# Patient Record
Sex: Female | Born: 1995 | Race: Black or African American | Hispanic: No | Marital: Single | Smoking: Current some day smoker
Health system: Southern US, Community
[De-identification: ages and names within clinical notes are randomized; demographics above are authoritative.]

## PROBLEM LIST (undated history)

## (undated) ENCOUNTER — Inpatient Hospital Stay (HOSPITAL_COMMUNITY): Payer: Self-pay

## (undated) DIAGNOSIS — Z789 Other specified health status: Secondary | ICD-10-CM

## (undated) DIAGNOSIS — F419 Anxiety disorder, unspecified: Secondary | ICD-10-CM

## (undated) HISTORY — PX: NO PAST SURGERIES: SHX2092

---
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
## 2015-02-10 ENCOUNTER — Inpatient Hospital Stay (HOSPITAL_COMMUNITY)
Admission: AD | Admit: 2015-02-10 | Discharge: 2015-02-10 | Disposition: A | Discharge status: Home / Self Care | Payer: Medicaid Other | Source: Ambulatory Visit | Attending: Obstetrics & Gynecology | Admitting: Obstetrics & Gynecology

## 2015-02-10 ENCOUNTER — Encounter (HOSPITAL_COMMUNITY): Payer: Self-pay | Admitting: *Deleted

## 2015-02-10 ENCOUNTER — Inpatient Hospital Stay (HOSPITAL_COMMUNITY): Payer: Medicaid Other

## 2015-02-10 ENCOUNTER — Emergency Department (HOSPITAL_COMMUNITY)
Admission: EM | Admit: 2015-02-10 | Discharge: 2015-02-10 | Disposition: A | Discharge status: Home / Self Care | Payer: Medicaid Other | Attending: Emergency Medicine | Admitting: Emergency Medicine

## 2015-02-10 DIAGNOSIS — M545 Low back pain, unspecified: Secondary | ICD-10-CM

## 2015-02-10 DIAGNOSIS — E860 Dehydration: Secondary | ICD-10-CM | POA: Insufficient documentation

## 2015-02-10 DIAGNOSIS — O26893 Other specified pregnancy related conditions, third trimester: Secondary | ICD-10-CM | POA: Insufficient documentation

## 2015-02-10 DIAGNOSIS — O0933 Supervision of pregnancy with insufficient antenatal care, third trimester: Secondary | ICD-10-CM | POA: Insufficient documentation

## 2015-02-10 DIAGNOSIS — Z3A35 35 weeks gestation of pregnancy: Secondary | ICD-10-CM | POA: Diagnosis not present

## 2015-02-10 DIAGNOSIS — O9989 Other specified diseases and conditions complicating pregnancy, childbirth and the puerperium: Secondary | ICD-10-CM | POA: Diagnosis present

## 2015-02-10 DIAGNOSIS — R1030 Lower abdominal pain, unspecified: Secondary | ICD-10-CM | POA: Insufficient documentation

## 2015-02-10 DIAGNOSIS — R102 Pelvic and perineal pain: Secondary | ICD-10-CM | POA: Insufficient documentation

## 2015-02-10 DIAGNOSIS — O99283 Endocrine, nutritional and metabolic diseases complicating pregnancy, third trimester: Secondary | ICD-10-CM | POA: Diagnosis not present

## 2015-02-10 DIAGNOSIS — Z369 Encounter for antenatal screening, unspecified: Secondary | ICD-10-CM

## 2015-02-10 HISTORY — DX: Other specified health status: Z78.9

## 2015-02-10 LAB — URINALYSIS, ROUTINE W REFLEX MICROSCOPIC
Bilirubin Urine: NEGATIVE
Glucose, UA: NEGATIVE mg/dL
HGB URINE DIPSTICK: NEGATIVE
Ketones, ur: 15 mg/dL — AB
Leukocytes, UA: NEGATIVE
Nitrite: NEGATIVE
Protein, ur: NEGATIVE mg/dL
SPECIFIC GRAVITY, URINE: 1.024 (ref 1.005–1.030)
UROBILINOGEN UA: 1 mg/dL (ref 0.0–1.0)
pH: 6 (ref 5.0–8.0)

## 2015-02-10 LAB — OB RESULTS CONSOLE GC/CHLAMYDIA: Gonorrhea: NEGATIVE

## 2015-02-10 LAB — OB RESULTS CONSOLE GBS: GBS: POSITIVE

## 2015-02-10 MED ORDER — SODIUM CHLORIDE 0.9 % IV BOLUS (SEPSIS)
1000.0000 mL | Freq: Once | INTRAVENOUS | Status: AC
  Administered 2015-02-10: 1000 mL via INTRAVENOUS

## 2015-02-10 MED ORDER — ACETAMINOPHEN 500 MG PO TABS
1000.0000 mg | ORAL_TABLET | Freq: Once | ORAL | Status: AC
  Administered 2015-02-10: 1000 mg via ORAL
  Filled 2015-02-10: qty 2

## 2015-02-10 NOTE — Discharge Instructions (Signed)
Braxton Hicks Contractions °Contractions of the uterus can occur throughout pregnancy. Contractions are not always a sign that you are in labor.  °WHAT ARE BRAXTON HICKS CONTRACTIONS?  °Contractions that occur before labor are called Braxton Hicks contractions, or false labor. Toward the end of pregnancy (32-34 weeks), these contractions can develop more often and may become more forceful. This is not true labor because these contractions do not result in opening (dilatation) and thinning of the cervix. They are sometimes difficult to tell apart from true labor because these contractions can be forceful and people have different pain tolerances. You should not feel embarrassed if you go to the hospital with false labor. Sometimes, the only way to tell if you are in true labor is for your health care provider to look for changes in the cervix. °If there are no prenatal problems or other health problems associated with the pregnancy, it is completely safe to be sent home with false labor and await the onset of true labor. °HOW CAN YOU TELL THE DIFFERENCE BETWEEN TRUE AND FALSE LABOR? °False Labor °· The contractions of false labor are usually shorter and not as hard as those of true labor.   °· The contractions are usually irregular.   °· The contractions are often felt in the front of the lower abdomen and in the groin.   °· The contractions may go away when you walk around or change positions while lying down.   °· The contractions get weaker and are shorter lasting as time goes on.   °· The contractions do not usually become progressively stronger, regular, and closer together as with true labor.   °True Labor °· Contractions in true labor last 30-70 seconds, become very regular, usually become more intense, and increase in frequency.   °· The contractions do not go away with walking.   °· The discomfort is usually felt in the top of the uterus and spreads to the lower abdomen and low back.   °· True labor can be  determined by your health care provider with an exam. This will show that the cervix is dilating and getting thinner.   °WHAT TO REMEMBER °· Keep up with your usual exercises and follow other instructions given by your health care provider.   °· Take medicines as directed by your health care provider.   °· Keep your regular prenatal appointments.   °· Eat and drink lightly if you think you are going into labor.   °· If Braxton Hicks contractions are making you uncomfortable:   °¨ Change your position from lying down or resting to walking, or from walking to resting.   °¨ Sit and rest in a tub of warm water.   °¨ Drink 2-3 glasses of water. Dehydration may cause these contractions.   °¨ Do slow and deep breathing several times an hour.   °WHEN SHOULD I SEEK IMMEDIATE MEDICAL CARE? °Seek immediate medical care if: °· Your contractions become stronger, more regular, and closer together.   °· You have fluid leaking or gushing from your vagina.   °· You have a fever.   °· You pass blood-tinged mucus.   °· You have vaginal bleeding.   °· You have continuous abdominal pain.   °· You have low back pain that you never had before.   °· You feel your baby's head pushing down and causing pelvic pressure.   °· Your baby is not moving as much as it used to.   °  °This information is not intended to replace advice given to you by your health care provider. Make sure you discuss any questions you have with your health care   provider. °  °Document Released: 03/28/2005 Document Revised: 04/02/2013 Document Reviewed: 01/07/2013 °Elsevier Interactive Patient Education ©2016 [employer]vier Inc. ° °

## 2015-02-10 NOTE — MAU Provider Note (Signed)
History     CSN: [id number]  Arrival date and time: 02/10/15 4098   First Provider Initiated Contact with Patient 02/10/15 2045      No chief complaint on file.  HPI Ms. Sharon Young is a 19 y.o. G1P0 at [redacted]w[redacted]d who presents to MAU today from [facility] for further evaluation of a possible twin pregnancy. The patient had a bedside US performed at [facility] tonight that may have shown a twin gestation. She states that she was seen because she was concerned about contractions. She was having lower abdominal pain. She states that pain is only with ambulation. She denies pain now while resting. She denies vaginal bleeding, discharge or LOF. She reports good fetal movement. The patient is scheduled to start prenatal care and have anatomy scan tomorrow.   OB History    Gravida Para Term Preterm AB TAB SAB Ectopic Multiple Living   1               Past Medical History  Diagnosis Date  . Medical history non-contributory     Past Surgical History  Procedure Laterality Date  . No past surgeries      History reviewed. No pertinent family history.  Social History  Substance Use Topics  . Smoking status: Never Smoker   . Smokeless tobacco: None  . Alcohol Use: No    Allergies: No Known Allergies  Prescriptions prior to admission  Medication Sig Dispense Refill Last Dose  . Prenatal Multivit-Min-Fe-FA (PRENATAL VITAMINS) 0.8 MG tablet Take 1 tablet by mouth daily. (Patient not taking: Reported on 02/10/2015) 30 tablet 12 Not Taking at Unknown time    Review of Systems  Constitutional: Negative for fever.  Gastrointestinal: Positive for abdominal pain.  Genitourinary:       Neg - vaginal bleeding, discharge, LOF   Physical Exam   Blood pressure 121/69, pulse 85, temperature 98.5 F (36.9 C), temperature source Oral, resp. rate 20, height  (1.549 m), weight 156 lb 6 oz (70.931 kg), last menstrual period 06/05/2014.  Physical Exam  Nursing note and vitals  reviewed. Constitutional: She is oriented to person, place, and time. She appears well-developed and well-nourished. No distress.  HENT:  Head: Normocephalic and atraumatic.  Cardiovascular: Normal rate.   Respiratory: Effort normal.  GI: Soft. She exhibits no distension and no mass. There is no tenderness. There is no rebound and no guarding.  Neurological: She is alert and oriented to person, place, and time.  Skin: Skin is warm and dry. No erythema.  Psychiatric: She has a normal mood and affect.  Dilation: Fingertip Cervical Position: Posterior Exam by:: Harlon Flor PA and Sharen Hint [facility]  Results for orders placed or performed during the hospital encounter of 02/10/15 (from the past 24 hour(s))  Urinalysis, Routine w reflex microscopic     Status: Abnormal   Collection Time: 02/10/15  5:37 PM  Result Value Ref Range   Color, Urine AMBER (A) YELLOW   APPearance CLEAR CLEAR   Specific Gravity, Urine 1.024 1.005 - 1.030   pH 6.0 5.0 - 8.0   Glucose, UA NEGATIVE NEGATIVE mg/dL   Hgb urine dipstick NEGATIVE NEGATIVE   Bilirubin Urine NEGATIVE NEGATIVE   Ketones, ur 15 (A) NEGATIVE mg/dL   Protein, ur NEGATIVE NEGATIVE mg/dL   Urobilinogen, UA 1.0 0.0 - 1.0 mg/dL   Nitrite NEGATIVE NEGATIVE   Leukocytes, UA NEGATIVE NEGATIVE   Fetal Monitoring: Baseline: 140 bpm, moderate variability, + accelerations, no decelerations Contractions: few, irregular, mild  with UI  MAU Course  Procedures None  MDM UA today Discussed patient with Dr Penne LashLeggett. She recommends formal US to evaluate for twin pregnancy and obtain dating due to lack of prenatal care.  US Ob Limited ordered - preliminary US shows singleton pregnancy. Anatomy scan performed. Normal AFI and placenta without evidence of previa. Patient will just require follow-up with [facility] tomorrow and US will be cancelled.  1000 mg Tylenol given  Assessment and Plan  A: SIUP at  [place] ligament pain  P: Discharge home Tylenol PRN  for pain Discussed use of abdominal binder and warm shower/bath for pain Preterm labor precautions discussed Increased PO hydration advised Patient advised to follow-up with [facility] tomorrow as planned to start prenatal care Patient may return to MAU as needed or if her condition were to change or worsen   Marny LowensteinJulie N Jearld Hemp, PA-C  02/10/2015, 8:45 PM

## 2015-02-10 NOTE — MAU Note (Signed)
PT SAYS TONIGHT  SHE WENT  TO [facility]  AT 230PM-     FOR  UC  PAIN  .  SHE  WAS  IN MAU  IN   10-2014.     AT   [facility]   -  U/S    - DISCOVERED  TWINS.    [facility]  BUT  [facility]  TOLD  HER  TO COME  HERE  NOW.     NOW  IN TRIAGE  -  SHE  STILLS  FEELS  PAIN  WHEN SHE WALKS.   DENIES HSV AND MRSA.

## 2015-02-10 NOTE — ED Provider Notes (Signed)
CSN: [id number]     Arrival date & time 02/10/15  1450 History   First MD Initiated Contact with Patient 02/10/15 1509     Chief Complaint  Patient presents with  . Abdominal Pain     (Consider location/radiation/quality/duration/timing/severity/associated sxs/prior Treatment) Patient is a 19 y.o. female presenting with back pain. The history is provided by the patient.  Back Pain Location:  Lumbar spine Quality:  Aching Radiates to:  Does not radiate Pain severity:  Moderate Pain is:  Same all the time Onset quality:  Gradual Timing:  Intermittent Progression:  Worsening Chronicity:  New Relieved by:  None tried Worsened by:  Nothing tried Ineffective treatments:  None tried Associated symptoms: no abdominal pain, no chest pain, no dysuria, no fever and no headaches   Risk factors: pregnancy     Past Medical History  Diagnosis Date  . Medical history non-contributory    Past Surgical History  Procedure Laterality Date  . No past surgeries     History reviewed. No pertinent family history. Social History  Substance Use Topics  . Smoking status: Never Smoker   . Smokeless tobacco: None  . Alcohol Use: No   OB History    Gravida Para Term Preterm AB TAB SAB Ectopic Multiple Living   1              Review of Systems  Constitutional: Negative for fever.  HENT: Negative for facial swelling.   Respiratory: Negative for shortness of breath.   Cardiovascular: Negative for chest pain.  Gastrointestinal: Negative for abdominal pain.  Genitourinary: Negative for dysuria.  Musculoskeletal: Positive for back pain.  Skin: Negative for rash.  Neurological: Negative for headaches.  Psychiatric/Behavioral: Negative for confusion.      Allergies  Review of patient's allergies indicates no known allergies.  Home Medications   Prior to Admission medications   Medication Sig Start Date End Date Taking? Authorizing Provider  Prenatal Multivit-Min-Fe-FA (PRENATAL  VITAMINS) 0.8 MG tablet Take 1 tablet by mouth daily. Patient not taking: Reported on 02/10/2015 10/28/14   Aviva Signs, CNM   BP 135/107 mmHg  Pulse 98  Temp(Src) 98.1 F (36.7 C) (Oral)  Resp 18  SpO2 99%  LMP 06/05/2014 (Approximate) Physical Exam  Constitutional: She is oriented to person, place, and time. She appears well-developed and well-nourished. No distress.  HENT:  Head: Normocephalic and atraumatic.  Right Ear: External ear normal.  Left Ear: External ear normal.  Nose: Nose normal.  Mouth/Throat: Oropharynx is clear and moist. No oropharyngeal exudate.  Eyes: Conjunctivae and EOM are normal. Pupils are equal, round, and reactive to light. Right eye exhibits no discharge. Left eye exhibits no discharge. No scleral icterus.  Neck: Normal range of motion. Neck supple. No JVD present. No tracheal deviation present. No thyromegaly present.  Cardiovascular: Normal rate, regular rhythm and intact distal pulses.   Pulmonary/Chest: Effort normal and breath sounds normal. No stridor. No respiratory distress. She has no wheezes. She has no rales. She exhibits no tenderness.  Abdominal: Soft. There is no tenderness.  Musculoskeletal: Normal range of motion. She exhibits no edema or tenderness.  Lymphadenopathy:    She has no cervical adenopathy.  Neurological: She is alert and oriented to person, place, and time. She displays no atrophy, no tremor and normal reflexes. She exhibits normal muscle tone. She displays no seizure activity. Coordination and gait normal. GCS eye subscore is 4. GCS verbal subscore is 5. GCS motor subscore is 6.  Skin: Skin is  warm and dry. No rash noted. She is not diaphoretic. No erythema. No pallor.  Psychiatric: She has a normal mood and affect. Her behavior is normal. Judgment and thought content normal.  Nursing note and vitals reviewed.   ED Course  Procedures (including critical care time) Labs Review Labs Reviewed  URINALYSIS, ROUTINE W REFLEX  MICROSCOPIC (NOT AT [facility]) - Abnormal; Notable for the following:    Color, Urine AMBER (*)    Ketones, ur 15 (*)    All other components within normal limits  CULTURE, BETA STREP (GROUP B ONLY)  GC/CHLAMYDIA PROBE AMP ([place]) NOT AT [facility]    Imaging Review [place] [facility] Ob Comp + 14 Wk  02/11/2015  OBSTETRICAL ULTRASOUND: This exam was performed within a [facility]. The OB US report was generated in the [facility] system, and faxed to the ordering physician.  This report is available in the [facility] [employer] PACS. See the [facility] Obstetric US report via the Image Link.  I have personally reviewed and evaluated these images and lab results as part of my medical decision-making.   EKG Interpretation None      MDM   Final diagnoses:  No prenatal care in current pregnancy, third trimester  Low back pain during pregnancy in third trimester  Dehydration    Pt at 35 wks by dates with low back pain.  No prior prenatal care.  Pt placed on toco by [facility] L&D nurse.  Reactive category 1 tracing.  No LOF, VB, or VD.  Pt having intermittent contractions.  Dehydration by history with low oral intake of fluids today.  No prior US of pregancy.  BSUS indeterminate due to late dates.  Discussed with OB o/c due to concern for possible twin pregnancy with different HR obtained in two locations by doppler, which do not correlate with maternal NR.  Pt was initially planned for close follow up tomorrow with anatomy US, but due to concern for possible twin gestation OB will [facility] tonight.  Pt otherwise stable.  D/c for outpatient follow up with OB this evening after leaving ED.  No signs of UTI.  Cultures sent for GC, and GBS.  Tylenol for pain.  Patient was given return precautions for pregnancy.  Pt advised on use of medications as applicable.  Advised to return for actely worsening symptoms, inability to take medications, or other acute concerns.  Advised to follow up with OB today.  Patient was in  agreement with and expressed understanding of follow plan, plan of care, and return precautions.  All questions answered prior to discharge.  Patient was discharged in stable condition, ambulating without difficulty.   Patient care was discussed with my attending, Dr. Verdie MosherLiu.    Gavin PoundJustin Brason Berthelot, MD 02/12/15 1120  Lavera Guiseana Duo Liu, MD 02/13/15 1215

## 2015-02-10 NOTE — ED Notes (Signed)
Pt states she is [redacted] weeks pregnant and has been having increasing pain in abdomen with walking. No vaginal discharge or bleeding.  LMP 06/05/14, Due Date 03/12/2015.  PT has not had any prenatal care. G-1 P-0 A-0.

## 2015-02-10 NOTE — ED Notes (Signed)
Pt verbalizes understanding of f/u care re: r/o twins per MD

## 2015-02-10 NOTE — Progress Notes (Signed)
Attempted to find FHR of twins [facility]. Not pregnant with twins. Confirmed via ultrasound.

## 2015-02-10 NOTE — ED Notes (Signed)
Discharge instructions given to patient.  [facility] for and ultrasound  Voiced understanding

## 2015-02-10 NOTE — Progress Notes (Signed)
1520  Arrived to evaluate this 19 yo G1P0 @ 35.[redacted] wks GA in with complaint of abdominal pain.  Denies vaginal bleeding or leaking of fluid.  Reports good fetal movement.  Patient does not receive prenatal care.  Upon placement of fetal monitors FHR is noted to be Category I and UC's are present.  UC's palpate mild and patient is not breathing through them.  1547  Call placed to Dr. Macon LargeAnyanwu.  She agrees that SVE is not necessary at this time.  She requests GC/CC and GBS labs be collected.  She will [facility] and will call [facility] RN back with arrangements.  1630 Patient is OB cleared. Pollux.RosserOBRR RN will report Dr. Mont DuttonAnyanwu's recommendations and plan for follow up to EDP.

## 2015-02-10 NOTE — ED Notes (Signed)
Ob rapid Response Rn at bedside, toco at bedside, pt A&O x4

## 2015-02-11 ENCOUNTER — Emergency Department (HOSPITAL_COMMUNITY): Payer: Medicaid Other

## 2015-02-11 ENCOUNTER — Encounter: Payer: Managed Care, Other (non HMO) | Admitting: Advanced Practice Midwife

## 2015-02-11 LAB — CULTURE, BETA STREP (GROUP B ONLY)

## 2015-02-11 NOTE — ED Provider Notes (Signed)
I saw and evaluated the patient, reviewed the resident's note and I agree with the findings and plan.   19 year old female G1P0 at 6935 weeks GA who presents with intermittent low back pan radiating into her lower groin/abdoment over past few days. No vaginal bleeding or leakage of fluids. No N/V/D or urinary complaints. Pain primarily reproduced with movement, and seems MSK related. Abdomen gravid, but non-tender to palpation. Blood work unremarkable. OB nurse evaluated patient at bedside and monitoring not suggestive of contractions. No prior prenatal care, thus was arranged for anatomy US tomorrow with OB. Appropriate for discharge.   Lavera Guiseana Duo Anchor Dwan, MD 02/11/15 1300

## 2015-02-13 LAB — GC/CHLAMYDIA PROBE AMP (~~LOC~~) NOT AT ARMC
Chlamydia: NEGATIVE
Neisseria Gonorrhea: NEGATIVE

## 2015-02-18 ENCOUNTER — Encounter: Payer: Self-pay | Admitting: Certified Nurse Midwife

## 2015-02-18 ENCOUNTER — Encounter: Payer: Managed Care, Other (non HMO) | Admitting: Certified Nurse Midwife

## 2015-02-25 ENCOUNTER — Encounter: Payer: Self-pay | Admitting: Physician Assistant

## 2015-02-25 ENCOUNTER — Ambulatory Visit (INDEPENDENT_AMBULATORY_CARE_PROVIDER_SITE_OTHER): Payer: Self-pay | Admitting: Physician Assistant

## 2015-02-25 VITALS — BP 107/80 | HR 104 | Temp 97.7°F | Ht 60.0 in | Wt 158.3 lb

## 2015-02-25 DIAGNOSIS — O0933 Supervision of pregnancy with insufficient antenatal care, third trimester: Secondary | ICD-10-CM

## 2015-02-25 DIAGNOSIS — Z3403 Encounter for supervision of normal first pregnancy, third trimester: Secondary | ICD-10-CM

## 2015-02-25 DIAGNOSIS — Z3493 Encounter for supervision of normal pregnancy, unspecified, third trimester: Secondary | ICD-10-CM

## 2015-02-25 DIAGNOSIS — Z3490 Encounter for supervision of normal pregnancy, unspecified, unspecified trimester: Secondary | ICD-10-CM | POA: Insufficient documentation

## 2015-02-25 LAB — POCT URINALYSIS DIP (DEVICE)
Bilirubin Urine: NEGATIVE
Glucose, UA: NEGATIVE mg/dL
KETONES UR: NEGATIVE mg/dL
Leukocytes, UA: NEGATIVE
Nitrite: NEGATIVE
PH: 6 (ref 5.0–8.0)
PROTEIN: NEGATIVE mg/dL
SPECIFIC GRAVITY, URINE: 1.01 (ref 1.005–1.030)
Urobilinogen, UA: 0.2 mg/dL (ref 0.0–1.0)

## 2015-02-25 MED ORDER — PRENATAL VITAMINS 0.8 MG PO TABS: 1.0000 | ORAL_TABLET | Freq: Every day | ORAL | Status: DC

## 2015-02-25 NOTE — Addendum Note (Signed)
Addended by: Jill SideAY, Cosandra Plouffe L on: 02/25/2015 12:15 PM   Modules accepted: Orders

## 2015-02-25 NOTE — Patient Instructions (Signed)
Pain Relief During Labor and Delivery Everyone experiences pain differently, but labor causes severe pain for many women. The amount of pain you experience during labor and delivery depends on your pain tolerance, contraction strength, and your baby's size and position. There are many ways to prepare for and deal with the pain, including:   Taking prenatal classes to learn about labor and delivery. The more informed you are, the less anxious and afraid you may be. This can help lessen the pain.  Taking pain-relieving medicine during labor and delivery.  Learning breathing and relaxation techniques.  Taking a shower or bath.  Getting massaged.  Changing positions.  Placing an ice pack on your back. Discuss your pain control options with your health care provider during your prenatal visits.  WHAT ARE THE TWO TYPES OF PAIN-RELIEVING MEDICINES? 1. Analgesics. These are medicines that decrease pain without total loss of feeling or muscle movement. 2. Anesthetics. These are medicines that block all feeling, including pain. There can be minor side effects of both types, such as nausea, trouble concentrating, becoming sleepy, and lowering the heart rate of the baby. However, health care providers are careful to give doses that will not seriously affect the baby.  WHAT ARE THE SPECIFIC TYPES OF ANALGESICS AND ANESTHETICS? Systemic Analgesic Systemic pain medicines affect your whole body rather than focusing pain relief on the area of your body experiencing pain. This type of medicine is given either through an IV tube in your vein or by a shot (injection) into your muscle. This medicine will lessen your pain but will not stop it completely. It may also make you sleepy, but it will not make you lose consciousness.  Local Anesthetic Local anesthetic isused tonumb a small area of your body. The medicine is injected into the area of nerves that carry feeling to the vagina, vulva, or the area between  the vagina and anus (perineum).  General Anesthetic This type of medicine causes you to lose consciousness so you do not feel pain. It is usually used only in emergency situations during labor. It is given through an IV tube or face mask. Paracervical Block A paracervical block is a form of local anesthesia given during labor. Numbing medicine is injected into the right and left sides of the cervix and vagina. It helps to lessen the pain caused by contractions and stretching of the cervix. It may have to be given more than once.  Pudendal Block A pudendal block is another form of local anesthesia. It is used to relieve the pain associated with pushing or stretching of the perineum at the time of delivery. An injection is given deep through the vaginal wall into the pudendal nerve in the pelvis, numbing the perineum.  Epidural Anesthetic An epidural is an injection of numbing medicine given in the lower back and into the epidural space near your spinal cord. The epidural numbs the lower half of your body. You may be able to move your legs but will not be allowed to walk. Epidurals can be used for labor, delivery, or cesarean deliveries.  To prevent the medicine from wearing off, a small tube (catheter) may be threaded into the epidural space and taped in place to prevent it from slipping out. Medicine can then be given continuously in small doses through the tube until you deliver. Spinal Block A spinal block is similar to an epidural, but the medicine is injected into the spinal fluid, not the epidural space. A spinal block is only given   once. It starts to relieve pain quickly but lasts only 1-2 hours. Spinal blocks can also be used for cesarean deliveries.  Combined Spinal-Epidural Block Combined spinal-epidural blocks combine the benefits of both the spinal and epidural blocks. The spinal part acts quickly to relieve pain and the epidural provides continuous pain relief. Hydrotherapy Immersion in  warm water during labor may provide comfort and relaxation. It may also help to lessen pain, the use of anesthesia, and the length of labor. However, immersion in water during the delivery (water birth) may have some risk involved and studies to determine safety and risks are ongoing. If you are a healthy woman who is expecting an uncomplicated birth, talk with your health care provider to see if water birth is an option for you.    This information is not intended to replace advice given to you by your health care provider. Make sure you discuss any questions you have with your health care provider.   Document Released: 07/14/2008 Document Revised: 04/02/2013 Document Reviewed: 08/16/2012 Elsevier Interactive Patient Education 2016 Elsevier Inc.  

## 2015-02-25 NOTE — Progress Notes (Signed)
Breastfeeding tip of the week reviewed. Pt plans to bottlefeed and place baby for adoption. Working with [facility].

## 2015-02-25 NOTE — Progress Notes (Signed)
    Subjective:    Sharon Young is a G1P0 5553w6d being seen today for her first obstetrical visit.  Her obstetrical history is significant for planning for adoptive placement.. Patient does not intend to breast feed. Pregnancy history fully reviewed.  Patient reports no complaints.  Filed Vitals:   02/25/15 0803 02/25/15 0808  BP: 107/80   Pulse: 104   Temp: 97.7 F (36.5 C)   Height:  5' (1.524 m)  Weight: 158 lb 4.8 oz (71.804 kg)     HISTORY: OB History  Gravida Para Term Preterm AB SAB TAB Ectopic Multiple Living  1             # Outcome Date GA Lbr Len/2nd Weight Sex Delivery Anes PTL Lv  1 Current              Past Medical History  Diagnosis Date  . Medical history non-contributory    Past Surgical History  Procedure Laterality Date  . No past surgeries     Family History  Problem Relation Age of Onset  . Diabetes Mother   . Arthritis Father      Exam    Uterus:   gravid                                 System:     Skin: normal coloration and turgor, no rashes    Neurologic: oriented, normal mood, grossly non-focal   Extremities: no deformities, gait was normal for age, no musculoskeletal defects noted   HEENT extra ocular movement intact, oropharynx clear, no lesions, neck supple with midline trachea and thyroid without masses   Mouth/Teeth mucous membranes moist, pharynx normal without lesions and dental hygiene good   Neck supple and no masses   Cardiovascular: regular rate and rhythm, no murmurs or gallops   Respiratory:  appears well, vitals normal, no respiratory distress, acyanotic, normal RR, ear and throat exam is normal, neck free of mass or lymphadenopathy, chest clear, no wheezing, crepitations, rhonchi, normal symmetric air entry   Abdomen: soft, non-tender; bowel sounds normal; no masses,  no organomegaly          Assessment:    Pregnancy: G1P0 Patient Active Problem List   Diagnosis Date Noted  . Late prenatal care  affecting pregnancy in third trimester 02/25/2015  . Supervision of normal first pregnancy in third trimester 02/25/2015  Adoption planned (agency is [facility])      Plan:     Initial labs drawn. Prenatal vitamins. Problem list reviewed and updated. Genetic Screening discussed Quad Screen: too late.  Ultrasound discussed; fetal survey: results reviewed.  Follow up in 1 weeks. 100% of 45 min visit spent on counseling and coordination of care.  Flu shot today   Bertram DenverKaren E Teague Clark 02/25/2015

## 2015-02-26 LAB — PRESCRIPTION MONITORING PROFILE (19 PANEL)
Amphetamine/Meth: NEGATIVE ng/mL
BARBITURATE SCREEN, URINE: NEGATIVE ng/mL
Benzodiazepine Screen, Urine: NEGATIVE ng/mL
Buprenorphine, Urine: NEGATIVE ng/mL
CARISOPRODOL, URINE: NEGATIVE ng/mL
COCAINE METABOLITES: NEGATIVE ng/mL
CREATININE, URINE: 38.81 mg/dL (ref >20.0)
Cannabinoid Scrn, Ur: NEGATIVE ng/mL
ECSTASY: NEGATIVE ng/mL
Fentanyl, Ur: NEGATIVE ng/mL
MEPERIDINE UR: NEGATIVE ng/mL
Methadone Screen, Urine: NEGATIVE ng/mL
Methaqualone: NEGATIVE ng/mL
Nitrites, Initial: NEGATIVE ug/mL
OXYCODONE SCRN UR: NEGATIVE ng/mL
Opiate Screen, Urine: NEGATIVE ng/mL
PH URINE, INITIAL: 5.2 pH (ref 4.5–8.9)
Phencyclidine, Ur: NEGATIVE ng/mL
Propoxyphene: NEGATIVE ng/mL
TRAMADOL UR: NEGATIVE ng/mL
Tapentadol, urine: NEGATIVE ng/mL
ZOLPIDEM, URINE: NEGATIVE ng/mL

## 2015-02-26 LAB — PRENATAL PROFILE (SOLSTAS)
Antibody Screen: NEGATIVE
Basophils Absolute: 0 10*3/uL (ref 0.0–0.1)
Basophils Relative: 0 % (ref 0–1)
Eosinophils Absolute: 0.1 10*3/uL (ref 0.0–0.7)
Eosinophils Relative: 1 % (ref 0–5)
HCT: 34.9 % — ABNORMAL LOW (ref 36.0–46.0)
HEP B S AG: NEGATIVE
HIV 1&2 Ab, 4th Generation: NONREACTIVE
Hemoglobin: 12.3 g/dL (ref 12.0–15.0)
LYMPHS ABS: 2.5 10*3/uL (ref 0.7–4.0)
Lymphocytes Relative: 34 % (ref 12–46)
MCH: 30.7 pg (ref 26.0–34.0)
MCHC: 35.2 g/dL (ref 30.0–36.0)
MCV: 87 fL (ref 78.0–100.0)
MONO ABS: 0.5 10*3/uL (ref 0.1–1.0)
MONOS PCT: 7 % (ref 3–12)
MPV: 11.8 fL (ref 8.6–12.4)
NEUTROS ABS: 4.3 10*3/uL (ref 1.7–7.7)
Neutrophils Relative %: 58 % (ref 43–77)
Platelets: 142 10*3/uL — ABNORMAL LOW (ref 150–400)
RBC: 4.01 MIL/uL (ref 3.87–5.11)
RDW: 14 % (ref 11.5–15.5)
RUBELLA: 5.52 {index} — AB (ref <0.90)
Rh Type: POSITIVE
WBC: 7.4 10*3/uL (ref 4.0–10.5)

## 2015-02-26 LAB — GLUCOSE TOLERANCE, 1 HOUR (50G) W/O FASTING: GLUCOSE 1 HOUR GTT: 92 mg/dL (ref 70–140)

## 2015-02-26 LAB — CULTURE, OB URINE
Colony Count: NO GROWTH
Organism ID, Bacteria: NO GROWTH

## 2015-02-27 LAB — HEMOGLOBINOPATHY EVALUATION
HGB A2 QUANT: 3 % (ref 2.2–3.2)
Hemoglobin Other: 0 %
Hgb A: 97 % (ref 96.8–97.8)
Hgb F Quant: 0 % (ref 0.0–2.0)
Hgb S Quant: 0 %

## 2015-03-04 ENCOUNTER — Ambulatory Visit (INDEPENDENT_AMBULATORY_CARE_PROVIDER_SITE_OTHER): Payer: Medicaid Other | Admitting: Advanced Practice Midwife

## 2015-03-04 VITALS — BP 133/68 | HR 96 | Temp 98.2°F | Wt 158.6 lb

## 2015-03-04 DIAGNOSIS — O0933 Supervision of pregnancy with insufficient antenatal care, third trimester: Secondary | ICD-10-CM | POA: Diagnosis present

## 2015-03-04 DIAGNOSIS — O99820 Streptococcus B carrier state complicating pregnancy: Secondary | ICD-10-CM | POA: Insufficient documentation

## 2015-03-04 DIAGNOSIS — Z3403 Encounter for supervision of normal first pregnancy, third trimester: Secondary | ICD-10-CM

## 2015-03-04 LAB — POCT URINALYSIS DIP (DEVICE)
Bilirubin Urine: NEGATIVE
Glucose, UA: NEGATIVE mg/dL
HGB URINE DIPSTICK: NEGATIVE
KETONES UR: NEGATIVE mg/dL
Nitrite: NEGATIVE
PH: 5.5 (ref 5.0–8.0)
PROTEIN: NEGATIVE mg/dL
Urobilinogen, UA: 0.2 mg/dL (ref 0.0–1.0)

## 2015-03-04 NOTE — Patient Instructions (Signed)
Contraception Choices Contraception (birth control) is the use of any methods or devices to prevent pregnancy. Below are some methods to help avoid pregnancy. HORMONAL METHODS   Contraceptive implant. This is a thin, plastic tube containing progesterone hormone. It does not contain estrogen hormone. Your health care provider inserts the tube in the inner part of the upper arm. The tube can remain in place for up to 3 years. After 3 years, the implant must be removed. The implant prevents the ovaries from releasing an egg (ovulation), thickens the cervical mucus to prevent sperm from entering the uterus, and thins the lining of the inside of the uterus.  Progesterone-only injections. These injections are given every 3 months by your health care provider to prevent pregnancy. This synthetic progesterone hormone stops the ovaries from releasing eggs. It also thickens cervical mucus and changes the uterine lining. This makes it harder for sperm to survive in the uterus.  Birth control pills. These pills contain estrogen and progesterone hormone. They work by preventing the ovaries from releasing eggs (ovulation). They also cause the cervical mucus to thicken, preventing the sperm from entering the uterus. Birth control pills are prescribed by a health care provider.Birth control pills can also be used to treat heavy periods.  Minipill. This type of birth control pill contains only the progesterone hormone. They are taken every day of each month and must be prescribed by your health care provider.  Birth control patch. The patch contains hormones similar to those in birth control pills. It must be changed once a week and is prescribed by a health care provider.  Vaginal ring. The ring contains hormones similar to those in birth control pills. It is left in the vagina for 3 weeks, removed for 1 week, and then a new one is put back in place. The patient must be comfortable inserting and removing the ring  from the vagina.A health care provider's prescription is necessary.  Emergency contraception. Emergency contraceptives prevent pregnancy after unprotected sexual intercourse. This pill can be taken right after sex or up to 5 days after unprotected sex. It is most effective the sooner you take the pills after having sexual intercourse. Most emergency contraceptive pills are available without a prescription. Check with your pharmacist. Do not use emergency contraception as your only form of birth control. BARRIER METHODS   Female condom. This is a thin sheath (latex or rubber) that is worn over the penis during sexual intercourse. It can be used with spermicide to increase effectiveness.  Female condom. This is a soft, loose-fitting sheath that is put into the vagina before sexual intercourse.  Diaphragm. This is a soft, latex, dome-shaped barrier that must be fitted by a health care provider. It is inserted into the vagina, along with a spermicidal jelly. It is inserted before intercourse. The diaphragm should be left in the vagina for 6 to 8 hours after intercourse.  Cervical cap. This is a round, soft, latex or plastic cup that fits over the cervix and must be fitted by a health care provider. The cap can be left in place for up to 48 hours after intercourse.  Sponge. This is a soft, circular piece of polyurethane foam. The sponge has spermicide in it. It is inserted into the vagina after wetting it and before sexual intercourse.  Spermicides. These are chemicals that kill or block sperm from entering the cervix and uterus. They come in the form of creams, jellies, suppositories, foam, or tablets. They do not require a   prescription. They are inserted into the vagina with an applicator before having sexual intercourse. The process must be repeated every time you have sexual intercourse. INTRAUTERINE CONTRACEPTION  Intrauterine device (IUD). This is a T-shaped device that is put in a woman's uterus  during a menstrual period to prevent pregnancy. There are 2 types:  Copper IUD. This type of IUD is wrapped in copper wire and is placed inside the uterus. Copper makes the uterus and fallopian tubes produce a fluid that kills sperm. It can stay in place for 10 years.  Hormone IUD. This type of IUD contains the hormone progestin (synthetic progesterone). The hormone thickens the cervical mucus and prevents sperm from entering the uterus, and it also thins the uterine lining to prevent implantation of a fertilized egg. The hormone can weaken or kill the sperm that get into the uterus. It can stay in place for 3-5 years, depending on which type of IUD is used. PERMANENT METHODS OF CONTRACEPTION  Female tubal ligation. This is when the woman's fallopian tubes are surgically sealed, tied, or blocked to prevent the egg from traveling to the uterus.  Hysteroscopic sterilization. This involves placing a small coil or insert into each fallopian tube. Your doctor uses a technique called hysteroscopy to do the procedure. The device causes scar tissue to form. This results in permanent blockage of the fallopian tubes, so the sperm cannot fertilize the egg. It takes about 3 months after the procedure for the tubes to become blocked. You must use another form of birth control for these 3 months.  Female sterilization. This is when the female has the tubes that carry sperm tied off (vasectomy).This blocks sperm from entering the vagina during sexual intercourse. After the procedure, the man can still ejaculate fluid (semen). NATURAL PLANNING METHODS  Natural family planning. This is not having sexual intercourse or using a barrier method (condom, diaphragm, cervical cap) on days the woman could become pregnant.  Calendar method. This is keeping track of the length of each menstrual cycle and identifying when you are fertile.  Ovulation method. This is avoiding sexual intercourse during ovulation.  Symptothermal  method. This is avoiding sexual intercourse during ovulation, using a thermometer and ovulation symptoms.  Post-ovulation method. This is timing sexual intercourse after you have ovulated. Regardless of which type or method of contraception you choose, it is important that you use condoms to protect against the transmission of sexually transmitted infections (STIs). Talk with your health care provider about which form of contraception is most appropriate for you.   This information is not intended to replace advice given to you by your health care provider. Make sure you discuss any questions you have with your health care provider.   Document Released: 03/28/2005 Document Revised: 04/02/2013 Document Reviewed: 09/20/2012 Elsevier Interactive Patient Education 2016 Elsevier Inc.  

## 2015-03-04 NOTE — Progress Notes (Signed)
Subjective:  Sharon Young is a 19 y.o. G1P0 at 6240w6d being seen today for ongoing prenatal care.  She is currently monitored for the following issues for this high-risk pregnancy and has Late prenatal care affecting pregnancy in third trimester; Supervision of normal first pregnancy in third trimester; Pregnancy with adoption planned; and Group B Streptococcus carrier, antepartum on her problem list.  Patient reports occasional contractions.  Contractions: Not present. Vag. Bleeding: None.  Movement: Present. Denies leaking of fluid.   The following portions of the patient's history were reviewed and updated as appropriate: allergies, current medications, past family history, past medical history, past social history, past surgical history and problem list. Problem list updated.  Objective:   Filed Vitals:   03/04/15 1106  BP: 133/68  Pulse: 96  Temp: 98.2 F (36.8 C)  Weight: 158 lb 9.6 oz (71.94 kg)    Fetal Status: Fetal Heart Rate (bpm): 148 Fundal Height: 38 cm Movement: Present  Presentation: Vertex  General:  Alert, oriented and cooperative. Patient is in no acute distress.  Skin: Skin is warm and dry. No rash noted.   Cardiovascular: Normal heart rate noted  Respiratory: Normal respiratory effort, no problems with respiration noted  Abdomen: Soft, gravid, appropriate for gestational age. Pain/Pressure: Present     Pelvic: Vag. Bleeding: None     Cervical exam deferred        Extremities: Normal range of motion.  Edema: None  Mental Status: Normal mood and affect. Normal behavior. Normal judgment and thought content.   Urinalysis: Urine Protein: Negative Urine Glucose: Negative  Assessment and Plan:  Pregnancy: G1P0 at 1640w6d  1. Supervision of normal first pregnancy in third trimester   2. Late prenatal care affecting pregnancy in third trimester   3. Group B Streptococcus carrier, antepartum   Term labor symptoms and general obstetric precautions including but  not limited to vaginal bleeding, contractions, leaking of fluid and fetal movement were reviewed in detail with the patient. Please refer to After Visit Summary for other counseling recommendations.  Contraception info given. Considering Nexplanon vs Depo. Return in about 1 week (around 03/11/2015).   Dorathy KinsmanVirginia Dianelys Scinto, CNM

## 2015-03-10 ENCOUNTER — Inpatient Hospital Stay (HOSPITAL_COMMUNITY): Payer: Medicaid Other | Admitting: Anesthesiology

## 2015-03-10 ENCOUNTER — Encounter (HOSPITAL_COMMUNITY): Payer: Self-pay | Admitting: *Deleted

## 2015-03-10 ENCOUNTER — Inpatient Hospital Stay (HOSPITAL_COMMUNITY)
Admission: AD | Admit: 2015-03-10 | Discharge: 2015-03-12 | DRG: 775 | Disposition: A | Discharge status: Home / Self Care | Payer: Medicaid Other | Source: Ambulatory Visit | Attending: Obstetrics & Gynecology | Admitting: Obstetrics & Gynecology

## 2015-03-10 DIAGNOSIS — O9912 Other diseases of the blood and blood-forming organs and certain disorders involving the immune mechanism complicating childbirth: Secondary | ICD-10-CM | POA: Diagnosis present

## 2015-03-10 DIAGNOSIS — O99344 Other mental disorders complicating childbirth: Secondary | ICD-10-CM | POA: Diagnosis present

## 2015-03-10 DIAGNOSIS — O99820 Streptococcus B carrier state complicating pregnancy: Secondary | ICD-10-CM

## 2015-03-10 DIAGNOSIS — Z3A39 39 weeks gestation of pregnancy: Secondary | ICD-10-CM | POA: Diagnosis not present

## 2015-03-10 DIAGNOSIS — D696 Thrombocytopenia, unspecified: Secondary | ICD-10-CM | POA: Diagnosis present

## 2015-03-10 DIAGNOSIS — Z833 Family history of diabetes mellitus: Secondary | ICD-10-CM

## 2015-03-10 DIAGNOSIS — O99824 Streptococcus B carrier state complicating childbirth: Secondary | ICD-10-CM | POA: Diagnosis present

## 2015-03-10 DIAGNOSIS — IMO0001 Reserved for inherently not codable concepts without codable children: Secondary | ICD-10-CM

## 2015-03-10 DIAGNOSIS — D759 Disease of blood and blood-forming organs, unspecified: Secondary | ICD-10-CM | POA: Diagnosis present

## 2015-03-10 DIAGNOSIS — Z8261 Family history of arthritis: Secondary | ICD-10-CM

## 2015-03-10 DIAGNOSIS — F419 Anxiety disorder, unspecified: Secondary | ICD-10-CM | POA: Diagnosis present

## 2015-03-10 HISTORY — DX: Anxiety disorder, unspecified: F41.9

## 2015-03-10 LAB — TYPE AND SCREEN
ABO/RH(D): B POS
ANTIBODY SCREEN: NEGATIVE

## 2015-03-10 LAB — CBC
HEMATOCRIT: 35.2 % — AB (ref 36.0–46.0)
Hemoglobin: 12.4 g/dL (ref 12.0–15.0)
MCH: 30.2 pg (ref 26.0–34.0)
MCHC: 35.2 g/dL (ref 30.0–36.0)
MCV: 85.9 fL (ref 78.0–100.0)
PLATELETS: 150 10*3/uL (ref 150–400)
RBC: 4.1 MIL/uL (ref 3.87–5.11)
RDW: 13.7 % (ref 11.5–15.5)
WBC: 8.6 10*3/uL (ref 4.0–10.5)

## 2015-03-10 LAB — ABO/RH: ABO/RH(D): B POS

## 2015-03-10 MED ORDER — OXYTOCIN 40 UNITS IN LACTATED RINGERS INFUSION - SIMPLE MED
1.0000 m[IU]/min | INTRAVENOUS | Status: DC
  Administered 2015-03-10: 5 m[IU]/min via INTRAVENOUS
  Administered 2015-03-10: 1 m[IU]/min via INTRAVENOUS
  Filled 2015-03-10: qty 1000

## 2015-03-10 MED ORDER — PENICILLIN G POTASSIUM 5000000 UNITS IJ SOLR
5.0000 10*6.[IU] | Freq: Once | INTRAVENOUS | Status: AC
  Administered 2015-03-10: 5 10*6.[IU] via INTRAVENOUS
  Filled 2015-03-10: qty 5

## 2015-03-10 MED ORDER — FLEET ENEMA 7-19 GM/118ML RE ENEM: 1.0000 | ENEMA | Freq: Every day | RECTAL | Status: DC | PRN

## 2015-03-10 MED ORDER — OXYTOCIN BOLUS FROM INFUSION
500.0000 mL | INTRAVENOUS | Status: DC
  Administered 2015-03-11: 500 mL via INTRAVENOUS

## 2015-03-10 MED ORDER — ONDANSETRON HCL 4 MG/2ML IJ SOLN
4.0000 mg | Freq: Four times a day (QID) | INTRAMUSCULAR | Status: DC | PRN
  Administered 2015-03-10: 4 mg via INTRAVENOUS
  Filled 2015-03-10: qty 2

## 2015-03-10 MED ORDER — OXYTOCIN 40 UNITS IN LACTATED RINGERS INFUSION - SIMPLE MED
62.5000 mL/h | INTRAVENOUS | Status: DC
  Administered 2015-03-11: 62.5 mL/h via INTRAVENOUS

## 2015-03-10 MED ORDER — PHENYLEPHRINE 40 MCG/ML (10ML) SYRINGE FOR IV PUSH (FOR BLOOD PRESSURE SUPPORT)
80.0000 ug | PREFILLED_SYRINGE | INTRAVENOUS | Status: DC | PRN
  Filled 2015-03-10: qty 2
  Filled 2015-03-10: qty 20

## 2015-03-10 MED ORDER — EPHEDRINE 5 MG/ML INJ
10.0000 mg | INTRAVENOUS | Status: DC | PRN
  Filled 2015-03-10: qty 2

## 2015-03-10 MED ORDER — SODIUM BICARBONATE 8.4 % IV SOLN
INTRAVENOUS | Status: DC | PRN
  Administered 2015-03-10 (×2): 5 mL via EPIDURAL

## 2015-03-10 MED ORDER — PENICILLIN G POTASSIUM 5000000 UNITS IJ SOLR
2.5000 10*6.[IU] | INTRAVENOUS | Status: DC
  Administered 2015-03-10 – 2015-03-11 (×3): 2.5 10*6.[IU] via INTRAVENOUS
  Filled 2015-03-10 (×7): qty 2.5

## 2015-03-10 MED ORDER — DIPHENHYDRAMINE HCL 50 MG/ML IJ SOLN: 12.5000 mg | INTRAMUSCULAR | Status: DC | PRN

## 2015-03-10 MED ORDER — ACETAMINOPHEN 325 MG PO TABS
650.0000 mg | ORAL_TABLET | ORAL | Status: DC | PRN
  Filled 2015-03-10: qty 2

## 2015-03-10 MED ORDER — LIDOCAINE HCL (PF) 1 % IJ SOLN
30.0000 mL | INTRAMUSCULAR | Status: DC | PRN
  Administered 2015-03-11: 30 mL via SUBCUTANEOUS
  Filled 2015-03-10: qty 30

## 2015-03-10 MED ORDER — TERBUTALINE SULFATE 1 MG/ML IJ SOLN: 0.2500 mg | Freq: Once | INTRAMUSCULAR | Status: DC | PRN

## 2015-03-10 MED ORDER — LACTATED RINGERS IV SOLN
INTRAVENOUS | Status: DC
  Administered 2015-03-10 – 2015-03-11 (×3): via INTRAVENOUS

## 2015-03-10 MED ORDER — FENTANYL 2.5 MCG/ML BUPIVACAINE 1/10 % EPIDURAL INFUSION (WH - ANES)
14.0000 mL/h | INTRAMUSCULAR | Status: DC | PRN
  Administered 2015-03-10: 14 mL/h via EPIDURAL
  Administered 2015-03-10: 12 mL/h via EPIDURAL
  Administered 2015-03-11: 14 mL/h via EPIDURAL
  Filled 2015-03-10 (×2): qty 125

## 2015-03-10 MED ORDER — CITRIC ACID-SODIUM CITRATE 334-500 MG/5ML PO SOLN: 30.0000 mL | ORAL | Status: DC | PRN

## 2015-03-10 MED ORDER — LIDOCAINE HCL (PF) 1 % IJ SOLN
INTRAMUSCULAR | Status: DC | PRN
  Administered 2015-03-10: 2 mL via EPIDURAL
  Administered 2015-03-10: 3 mL via EPIDURAL
  Administered 2015-03-10: 5 mL via EPIDURAL

## 2015-03-10 MED ORDER — LACTATED RINGERS IV SOLN: 500.0000 mL | INTRAVENOUS | Status: DC | PRN

## 2015-03-10 MED ORDER — OXYCODONE-ACETAMINOPHEN 5-325 MG PO TABS: 1.0000 | ORAL_TABLET | ORAL | Status: DC | PRN

## 2015-03-10 MED ORDER — OXYCODONE-ACETAMINOPHEN 5-325 MG PO TABS: 2.0000 | ORAL_TABLET | ORAL | Status: DC | PRN

## 2015-03-10 NOTE — MAU Note (Signed)
Abdominal pain since 1900 yesterday, also pelvic pressure.  Denies bleeding or LOF.  Pain is cramping & intermittent like uc's.

## 2015-03-10 NOTE — Progress Notes (Signed)
Labor Progress Note Sharon Young is a 19 y.o. G1P0 at 2615w5d presented for contractions, SOL  S: Called to room for FHR decelerations.  O:  BP 109/63 mmHg  Pulse 70  Temp(Src) 98.6 F (37 C) (Oral)  Resp 16  Ht 5' (1.524 m)  Wt 160 lb (72.576 kg)  BMI 31.25 kg/m2  SpO2 100%  LMP 06/05/2014 (Approximate) EFM: 150/Mod/no accels, variable/early decels to 120s.  Excellent beat to beat variability  CVE: Dilation: 8 Effacement (%): 90 Cervical Position: Middle Station: -3 Presentation: Vertex Exam by:: Dr. Macon LargeAnyanwu  BBOW AROM at 2133 for clear fluid, FSE placed with much difficulty given fetal station  A&P: 19 y.o. G1P0 4215w5d in active labor with recurrent decelerations, now s/p AROM #Labor: continue pitocin for now #Pain: epidural #FWB: Cat II tracing, will continue close observation #GBS Positive on PCN #MOD Hopeful for vaginal delivery  [name], MD 9:40 PM

## 2015-03-10 NOTE — Progress Notes (Signed)
Labor Progress Note Sharon Young is a 19 y.o. G1P0 at 10853w5d presented for contractions, SOL  S: Called to room for decelerations.   O:  BP 97/58 mmHg  Pulse 43  Temp(Src) 98.6 F (37 C) (Oral)  Resp 16  Ht 5' (1.524 m)  Wt 160 lb (72.576 kg)  BMI 31.25 kg/m2  SpO2 90%  LMP 06/05/2014 (Approximate) EFM: 150/Mod/no accels, variable/early decels to 120s.  Excellent beat to beat variability  CVE: Dilation: Lip/rim Effacement (%): 90 Cervical Position: Middle Station: -1 Presentation: Vertex Exam by:: N Deal CC    A&P: 19 y.o. G1P0 4453w5d in SOL with rapid cervix change #Labor: continue pitocin at 1/2 dose (~3) #Pain: epidural, redosed #FWB: Cat II #GBS Pos  Federico FlakeKimberly Niles Tayana Shankle, MD 10:09 PM

## 2015-03-10 NOTE — H&P (Signed)
LABOR ADMISSION HISTORY AND PHYSICAL  Sharon Young is a 19 y.o. female G1P0 with IUP at 5763w5d from the MAU presenting for active labor. She reports +FM, + contractions, No LOF, no VB, no blurry vision, headaches or peripheral edema, and RUQ pain.  She plans on bottle feeding. She request Nexaplanon for birth control.  Dating: By LMP Estimated Date of Delivery: 03/12/15  Sono:    U/S @[redacted]w[redacted]d , CWD, normal anatomy, 2521g, 42% EFW   [facility]Com[facility]  Dating LMP/US 3rd trimester Blood type: B/POS/-- (11/16 0938)   Genetic Screen  Too late  Antibody:NEG (11/16 04540938)  Anatomic US Normal female Rubella: 5.52 (11/16 0938)  GTT  Third trimester: 98 RPR: NON REAC (11/16 0938)   Flu vaccine 02/25/15 HBsAg: NEGATIVE (11/16 [id number])   TDaP vaccine                                   HIV: NONREACTIVE (11/16 0938)   GBS  Pos              XBJ:YNWGNFAOGBS:Positive (11/01 0000)  Contraception Nexplanon vs Depo Pap:<21  Baby Food  bottle - adoption   Circumcision NA--adoption   Pediatrician adoption   Support Person FOB. Toya SmothersLakisha Brown (Adoption counselor)     Past Medical History: Past Medical History  Diagnosis Date  . Medical history non-contributory   . Anxiety     Past Surgical History: Past Surgical History  Procedure Laterality Date  . No past surgeries      Obstetrical History: OB History    Gravida Para Term Preterm AB TAB SAB Ectopic Multiple Living   1         0      Social History: Social History   Social History  . Marital Status: Single    Spouse Name: N/A  . Number of Children: N/A  . Years of Education: N/A   Social History Main Topics  . Smoking status: Never Smoker   . Smokeless tobacco: None  . Alcohol Use: Yes     Comment: occasionally - pre-pregnancy  . Drug Use: No  . Sexual Activity: Yes   Other Topics Concern  . None   Social History Narrative    Family History: Family History  Problem Relation Age of Onset  . Diabetes  Mother   . Arthritis Father     Allergies: No Known Allergies  Prescriptions prior to admission  Medication Sig Dispense Refill Last Dose  . acetaminophen (TYLENOL) 325 MG tablet Take 650 mg by mouth every 6 (six) hours as needed for moderate pain.   03/09/2015 at Unknown time  . Prenat w/o A-FE-Methfol-FA-DHA (PNV-DHA) 27-0.6-0.4-300 MG CAPS Take 1 tablet by mouth daily.  12 03/10/2015 at Unknown time     Review of Systems   All systems reviewed and negative except as stated in HPI  BP 111/77 mmHg  Pulse 89  Temp(Src) 97.5 F (36.4 C) (Oral)  Resp 18  Ht 5' (1.524 m)  Wt 160 lb (72.576 kg)  BMI 31.25 kg/m2  LMP 06/05/2014 (Approximate) General appearance: alert and cooperative Lungs: clear to auscultation bilaterally Heart: regular rate and rhythm Abdomen: soft, non-tender; bowel sounds normal Extremities: Homans sign is negative, no sign of DVT, edema Presentation: cephalic Fetal monitoringBaseline: 140 bpm, Variability: Good {> 6 bpm), Accelerations: Reactive and Decelerations: Absent Uterine activity: every 10 minutes  Dilation: 4 Effacement (%): 80 Station: -  2 Exam by:: Edger House, RN   Prenatal labs: ABO, Rh: --/--/B POS (11/29 1140) Antibody: NEG (11/29 1140) Rubella: !Error! RPR: NON REAC (11/16 0938)  HBsAg: NEGATIVE (11/16 1610)  HIV: NONREACTIVE (11/16 9604)  GBS: Positive (11/01 0000)  1 hr Glucola 92 Genetic screening  None Anatomy US normal anatomy   Prenatal Transfer Tool  Maternal Diabetes: No Genetic Screening: Declined Maternal Ultrasounds/Referrals: Normal Fetal Ultrasounds or other Referrals:  None Maternal Substance Abuse:  No Significant Maternal Medications:  None Significant Maternal Lab Results: Lab values include: Group B Strep positive  Results for orders placed or performed during the hospital encounter of 03/10/15 (from the past 24 hour(s))  CBC   Collection Time: 03/10/15 11:40 AM  Result Value Ref Range   WBC 8.6 4.0 -  10.5 K/uL   RBC 4.10 3.87 - 5.11 MIL/uL   Hemoglobin 12.4 12.0 - 15.0 g/dL   HCT 54.0 (L) 98.1 - 19.1 %   MCV 85.9 78.0 - 100.0 fL   MCH 30.2 26.0 - 34.0 pg   MCHC 35.2 30.0 - 36.0 g/dL   RDW 47.8 29.5 - 62.1 %   Platelets 150 150 - 400 K/uL  Type and screen [facility] [place] [facility]   Collection Time: 03/10/15 11:40 AM  Result Value Ref Range   ABO/RH(D) B POS    Antibody Screen NEG    Sample Expiration 03/13/2015     Patient Active Problem List   Diagnosis Date Noted  . Active labor at term 03/10/2015  . Group B Streptococcus carrier, antepartum 03/04/2015  . Late prenatal care affecting pregnancy in third trimester 02/25/2015  . Supervision of normal first pregnancy in third trimester 02/25/2015  . Pregnancy with adoption planned 02/25/2015    Assessment: Sharon Young is a 19 y.o. G1P0 at [redacted]w[redacted]d here for active labor   #Labor: Pitocin  #Pain: Epidural  #FWB:  #ID: GBS positive, Penicillin G  #MOF: Bottle  #MOC:Nexplanon inpt  #Circ:  No, will let adopting parents decide  Sharon Chars, Sharon Young   OB fellow attestation: I have seen and examined this patient; I agree with above documentation in the resident's note.   Sharon Young is a 19 y.o. G1P0 here for SOL  PE: BP 96/52 mmHg  Pulse 69  Temp(Src) 98.6 F (37 C) (Oral)  Resp 16  Ht 5' (1.524 m)  Wt 160 lb (72.576 kg)  BMI 31.25 kg/m2  SpO2 100%  LMP 06/05/2014 (Approximate) Gen: calm comfortable, NAD Resp: normal effort, no distress Abd: gravid  ROS, labs, PMH reviewed  Plan: Admit to LD Labor: Continue to monitor. Pitocin if ctx space, AROM if/when appropriate ID: PCN for GBS Social: Pecola Leisure is up for adoption. Mother does not desire to hold baby or do skin to skin.   Sharon Flake, [facility], OB Fellow 03/10/2015, 7:45 PM

## 2015-03-10 NOTE — Anesthesia Preprocedure Evaluation (Signed)
Anesthesia Evaluation  Patient identified by MRN, date of birth, ID band Patient awake    Reviewed: Allergy & Precautions, NPO status , Patient's Chart, lab work & pertinent test results  Airway Mallampati: II  TM Distance: >3 FB Neck ROM: Full    Dental  (+) Teeth Intact, Dental Advisory Given   Pulmonary neg pulmonary ROS,    Pulmonary exam normal breath sounds clear to auscultation       Cardiovascular Exercise Tolerance: Good negative cardio ROS Normal cardiovascular exam Rhythm:Regular Rate:Normal     Neuro/Psych PSYCHIATRIC DISORDERS Anxiety negative neurological ROS     GI/Hepatic negative GI ROS, Neg liver ROS,   Endo/Other  negative endocrine ROS  Renal/GU negative Renal ROS     Musculoskeletal negative musculoskeletal ROS (+)   Abdominal   Peds  Hematology  (+) Blood dyscrasia (Thrombocytopenia--platelets 150k), ,   Anesthesia Other Findings Day of surgery medications reviewed with the patient.  Reproductive/Obstetrics                             Anesthesia Physical Anesthesia Plan  ASA: II  Anesthesia Plan: Epidural   Post-op Pain Management:    Induction:   Airway Management Planned:   Additional Equipment:   Intra-op Plan:   Post-operative Plan:   Informed Consent: I have reviewed the patients History and Physical, chart, labs and discussed the procedure including the risks, benefits and alternatives for the proposed anesthesia with the patient or authorized representative who has indicated his/her understanding and acceptance.   Dental advisory given  Plan Discussed with:   Anesthesia Plan Comments: (Patient identified. Risks/Benefits/Options discussed with patient including but not limited to bleeding, infection, nerve damage, paralysis, failed block, incomplete pain control, headache, blood pressure changes, nausea, vomiting, reactions to medication both or  allergic, itching and postpartum back pain. Confirmed with bedside nurse the patient's most recent platelet count. Confirmed with patient that they are not currently taking any anticoagulation, have any bleeding history or any family history of bleeding disorders. Patient expressed understanding and wished to proceed. All questions were answered. )        Anesthesia Quick Evaluation

## 2015-03-10 NOTE — Anesthesia Procedure Notes (Signed)
Epidural Patient location during procedure: OB  Staffing Anesthesiologist: Blue Winther EDWARD Performed by: anesthesiologist   Preanesthetic Checklist Completed: patient identified, pre-op evaluation, timeout performed, IV checked, risks and benefits discussed and monitors and equipment checked  Epidural Patient position: sitting Prep: DuraPrep Patient monitoring: blood pressure and continuous pulse ox Approach: midline Location: L3-L4 Injection technique: LOR air  Needle:  Needle type: Tuohy  Needle gauge: 17 G Needle length: 9 cm Needle insertion depth: 6 cm Catheter size: 19 Gauge Catheter at skin depth: 11 cm Test dose: negative and Other (1% Lidocaine)  Additional Notes Patient identified.  Risk benefits discussed including failed block, incomplete pain control, headache, nerve damage, paralysis, blood pressure changes, nausea, vomiting, reactions to medication both toxic or allergic, and postpartum back pain.  Patient expressed understanding and wished to proceed.  All questions were answered.  Sterile technique used throughout procedure and epidural site dressed with sterile barrier dressing. No paresthesia or other complications noted. The patient did not experience any signs of intravascular injection such as tinnitus or metallic taste in mouth nor signs of intrathecal spread such as rapid motor block. Please see nursing notes for vital signs. Reason for block:procedure for pain   

## 2015-03-11 ENCOUNTER — Encounter: Payer: Medicaid Other | Admitting: Family

## 2015-03-11 ENCOUNTER — Encounter (HOSPITAL_COMMUNITY): Payer: Self-pay | Admitting: Certified Nurse Midwife

## 2015-03-11 ENCOUNTER — Encounter: Payer: Medicaid Other | Admitting: Advanced Practice Midwife

## 2015-03-11 DIAGNOSIS — Z3A39 39 weeks gestation of pregnancy: Secondary | ICD-10-CM

## 2015-03-11 DIAGNOSIS — O99824 Streptococcus B carrier state complicating childbirth: Secondary | ICD-10-CM

## 2015-03-11 LAB — CBC
HEMATOCRIT: 31.1 % — AB (ref 36.0–46.0)
HEMOGLOBIN: 10.9 g/dL — AB (ref 12.0–15.0)
MCH: 30.2 pg (ref 26.0–34.0)
MCHC: 35 g/dL (ref 30.0–36.0)
MCV: 86.1 fL (ref 78.0–100.0)
Platelets: 149 10*3/uL — ABNORMAL LOW (ref 150–400)
RBC: 3.61 MIL/uL — ABNORMAL LOW (ref 3.87–5.11)
RDW: 13.6 % (ref 11.5–15.5)
WBC: 16.2 10*3/uL — ABNORMAL HIGH (ref 4.0–10.5)

## 2015-03-11 LAB — RAPID URINE DRUG SCREEN, HOSP PERFORMED
Amphetamines: NOT DETECTED
BENZODIAZEPINES: NOT DETECTED
Barbiturates: NOT DETECTED
COCAINE: NOT DETECTED
Opiates: NOT DETECTED
Tetrahydrocannabinol: NOT DETECTED

## 2015-03-11 LAB — RPR: RPR Ser Ql: NONREACTIVE

## 2015-03-11 MED ORDER — ZOLPIDEM TARTRATE 5 MG PO TABS: 5.0000 mg | ORAL_TABLET | Freq: Every evening | ORAL | Status: DC | PRN

## 2015-03-11 MED ORDER — LANOLIN HYDROUS EX OINT: TOPICAL_OINTMENT | CUTANEOUS | Status: DC | PRN

## 2015-03-11 MED ORDER — ONDANSETRON HCL 4 MG PO TABS
4.0000 mg | ORAL_TABLET | ORAL | Status: DC | PRN
Start: 2015-03-11 — End: 2015-03-12

## 2015-03-11 MED ORDER — WITCH HAZEL-GLYCERIN EX PADS: 1.0000 "application " | MEDICATED_PAD | CUTANEOUS | Status: DC | PRN

## 2015-03-11 MED ORDER — SIMETHICONE 80 MG PO CHEW: 80.0000 mg | CHEWABLE_TABLET | ORAL | Status: DC | PRN

## 2015-03-11 MED ORDER — TETANUS-DIPHTH-ACELL PERTUSSIS 5-2.5-18.5 LF-MCG/0.5 IM SUSP: 0.5000 mL | Freq: Once | INTRAMUSCULAR | Status: DC

## 2015-03-11 MED ORDER — ACETAMINOPHEN 325 MG PO TABS
650.0000 mg | ORAL_TABLET | ORAL | Status: DC | PRN
  Administered 2015-03-11: 650 mg via ORAL

## 2015-03-11 MED ORDER — OXYCODONE-ACETAMINOPHEN 5-325 MG PO TABS: 1.0000 | ORAL_TABLET | ORAL | Status: DC | PRN

## 2015-03-11 MED ORDER — SENNOSIDES-DOCUSATE SODIUM 8.6-50 MG PO TABS
2.0000 | ORAL_TABLET | ORAL | Status: DC
  Administered 2015-03-12: 2 via ORAL
  Filled 2015-03-11: qty 2

## 2015-03-11 MED ORDER — OXYCODONE-ACETAMINOPHEN 5-325 MG PO TABS: 2.0000 | ORAL_TABLET | ORAL | Status: DC | PRN

## 2015-03-11 MED ORDER — DIPHENHYDRAMINE HCL 25 MG PO CAPS: 25.0000 mg | ORAL_CAPSULE | Freq: Four times a day (QID) | ORAL | Status: DC | PRN

## 2015-03-11 MED ORDER — PRENATAL MULTIVITAMIN CH
1.0000 | ORAL_TABLET | Freq: Every day | ORAL | Status: DC
  Administered 2015-03-11: 1 via ORAL
  Filled 2015-03-11 (×2): qty 1

## 2015-03-11 MED ORDER — ONDANSETRON HCL 4 MG/2ML IJ SOLN: 4.0000 mg | INTRAMUSCULAR | Status: DC | PRN

## 2015-03-11 MED ORDER — DIBUCAINE 1 % RE OINT: 1.0000 "application " | TOPICAL_OINTMENT | RECTAL | Status: DC | PRN

## 2015-03-11 MED ORDER — IBUPROFEN 600 MG PO TABS
600.0000 mg | ORAL_TABLET | Freq: Four times a day (QID) | ORAL | Status: DC
  Administered 2015-03-11 – 2015-03-12 (×6): 600 mg via ORAL
  Filled 2015-03-11 (×6): qty 1

## 2015-03-11 MED ORDER — BENZOCAINE-MENTHOL 20-0.5 % EX AERO: 1.0000 "application " | INHALATION_SPRAY | CUTANEOUS | Status: DC | PRN

## 2015-03-11 NOTE — Clinical Social Work Note (Signed)
Clinical Social Work Assessment  Patient Details  Name: Sharon Young MRN: [id number] Date of Birth: 22-Jan-1996  Date of referral:  03/10/15               Reason for consult:  Adoption             Permission sought to share information with:  Adoption agency Permission granted to share information::  Yes, Release of Information Signed  Name::     Reyes Ivan  Agency::  [facility]  Relationship::  Adoptive parents  Contact Information:     Housing/Transportation Living arrangements for the past 2 months:  Apartment Source of Information:  Patient Patient Interpreter Needed:  None Criminal Activity/Legal Involvement Pertinent to Current Situation/Hospitalization:  No  Significant Relationships:  Significant Other, Friend, Warehouse manager, Other Family Members Lives with:  Friends, Significant Other Do you feel safe going back to the place where you live?   Yes Need for family participation in patient care:  No   Care giving concerns:  None identified  Facilities manager / plan:   CSW met with patient due to patient presenting with plans to place infant up to adoption.   Patient presented as easily engaged and receptive to the visit. She displayed a full range in affect and was noted to be in a pleasant mood.   Patient reported that she doing "well", and shared belief that the "worst part is over" (referring to giving birth).  Patient shared belief that she is recovering well, and intends to stay at the hospital today in order to continue to recover. Patient was alone in her room, and stated that her boyfriend has been present but had to go to work today.  Patient openly discussed events that led to her decision to place the infant up for adoption.  She shared that she did not learn that she was pregnant until she was 6 months along, and reported that she and her boyfriend felt that they were not in a position to become parents. She reported that she is currently working  and attending school to pursue a degree in [employer]. Patient shared that she felt it would be best for the infant and themselves to provide another family with an opportunity to raise the infant.  She discussed how she started to research adoption agencies, and began to create an adoption plan with [facility].  Patient reported having strong rapport and a good relationship with the adoption worker, Reyes Ivan, and shared that she is pursuing an open adoption.  She stated that she feels comfortable and confident with the adoption plan, and reported that she is framing the adoption as an opportunity to provide another family with an infant instead of focusing on "giving it up/away".  Patient discussed challenges since her family members have questioned her decisions, but she shared that she and the boyfriend decided that they needed to make their own decisions based on what they viewed as what is best for the infant and themselves.   Patient denied mental health complications during the pregnancy. She stated that she never allowed herself to become attached to the infant due to the plans for adoption.  Patient recognized that it will be impossible to not have any mental health complications with an adoption, but reported that she feels supported by her Development worker, international aid. Patient is unsure of any post-adoption counseling that may be available through [place], but acknowledged the importance of talking to her  boyfriend and her support system about how she is feeling.   Patient expressed plans and readiness to sign adoption paperwork today, and shared that the adoption worker is present at the hospital.  CSW requested to be contacted with paperwork has been completed, patient agreeable.   Patient denied need for additional help and support as she prepares to transition home.  She expressed appreciation for all staff at the hospital, and denied any current questions, concerns, or needs.  CSW  briefly spoke with Development worker, international aid as CSW left patient's room.  Per Development worker, international aid, she is a Insurance account manager and will notarize all paperwork.  CSW requested to be contacted when paperwork is complete in order to receive copies needed for patient's and infant's chart.    Employment status:  Psychologist, counselling:  Medicaid In [place] PT Recommendations:  Not assessed at this time Information / Referral to community resources:   No needs identified.  Patient/Family's Understanding of and Emotional Response to Diagnosis, Current Treatment, and Prognosis:  Patient shared belief that she is coping well and is feeling confident with her decision to place the infant up for adoption. She discussed at length how she feels it is best for the infant and her situation.  Patient shared that she is ready to sign paperwork with her adoption worker, and feels well supported by the adoption agency.  Patient shared that she is attempting to remain positive and optimistic about the process, and demonstrated during the assessment how she has been able to positively re-frame stressors and decisions during the pregnancy and now postpartum.  Emotional Assessment Appearance:  Appears stated age, Developmentally appropriate, Well-Groomed Attitude/Demeanor/Rapport:  Other Affect (typically observed):  Accepting, Adaptable, Happy, Pleasant Orientation:  Oriented to Self, Oriented to Place, Oriented to  Time, Oriented to Situation Alcohol / Substance use:  Never Used Psych involvement (Current and /or in the community):  No   Discharge Needs  Concerns to be addressed:  Adoption papers to be signed, CSW to obtain copy of paperwork once signed and notarized.   Readmission within the last 30 days:  No Current discharge risk:  None Barriers to Discharge:  Adoption papers not yet signed or notarized.   Sheilah Mins, LCSW 03/11/2015, 12:21 PM

## 2015-03-11 NOTE — Progress Notes (Signed)
Cord clamped and cut. Infant to warmer. Infant dried, stimulated and suctioned. Code APGAR called. PPV intitated.

## 2015-03-11 NOTE — Anesthesia Postprocedure Evaluation (Signed)
Anesthesia Post Note  Patient: Sharon Young  Procedure(s) Performed: * No procedures listed *  Patient location during evaluation: [facility] Anesthesia Type: Epidural Level of consciousness: awake Pain management: satisfactory to patient Vital Signs Assessment: post-procedure vital signs reviewed and stable Respiratory status: spontaneous breathing Cardiovascular status: stable Anesthetic complications: no    Last Vitals:  Filed Vitals:   03/11/15 0500 03/11/15 0600  BP: 112/54 115/63  Pulse: 72 63  Temp: 37.3 C 37.6 C  Resp: 18 18    Last Pain:  Filed Vitals:   03/11/15 1319  PainSc: 3                  Yajayra Feldt

## 2015-03-12 MED ORDER — IBUPROFEN 600 MG PO TABS: 600.0000 mg | ORAL_TABLET | Freq: Four times a day (QID) | ORAL | Status: DC | PRN

## 2015-03-12 MED ORDER — LIDOCAINE HCL 1 % IJ SOLN
0.0000 mL | Freq: Once | INTRAMUSCULAR | Status: DC | PRN
  Filled 2015-03-12: qty 20

## 2015-03-12 MED ORDER — ETONOGESTREL 68 MG ~~LOC~~ IMPL
68.0000 mg | DRUG_IMPLANT | Freq: Once | SUBCUTANEOUS | Status: AC
  Administered 2015-03-12: 68 mg via SUBCUTANEOUS
  Filled 2015-03-12: qty 1

## 2015-03-12 NOTE — Discharge Instructions (Signed)

## 2015-03-12 NOTE — Discharge Summary (Signed)
OB Discharge Summary     Patient Name: Sharon BassetShakyra A Young DOB: 05-02-1995 MRN: [id number]  Date of admission: 03/10/2015 Delivering MD: Yolande JollyMELANCON, CALEB G   Date of discharge: 03/12/2015  Admitting diagnosis: 39WKS,LABOR Intrauterine pregnancy: 7210w6d     Secondary diagnosis:  Active Problems:   Active labor at term  Additional problems: none     Discharge diagnosis: Term Pregnancy Delivered                                                                                                Post partum procedures:N/A  Augmentation: AROM and Pitocin  Complications: None  Hospital course:  Onset of Labor With Vaginal Delivery     19 y.o. yo G1P1001 at 2210w6d was admitted in Latent Laboron 03/10/2015. Patient had an uncomplicated labor course as follows:  Membrane Rupture Time/Date: 9:33 PM ,03/10/2015   Intrapartum Procedures: Episiotomy: None [1]                                         Lacerations:  Labial [10];1st degree [2]  Patient had a delivery of a Viable infant. 03/11/2015  Information for the patient's newborn:  Marthenia RollingSilver, Boy Kriste [[id number]][[id number]]  Delivery Method: Vaginal, Spontaneous Delivery (Filed from Delivery Summary)    Pateint had an uncomplicated postpartum course.  She is ambulating, tolerating a regular diet, passing flatus, and urinating well. Patient is discharged home in stable condition on 03/12/15.   Physical exam  Filed Vitals:   03/11/15 0600 03/11/15 1930 03/11/15 2158 03/12/15 0517  BP: 115/63 112/51 107/51 90/62  Pulse: 63 65 79 85  Temp: 99.6 F (37.6 C) 98.7 F (37.1 C) 98.4 F (36.9 C) 98 F (36.7 C)  TempSrc:  Oral Oral Oral  Resp: 18 16 18 16   Height:  5' (1.524 m)    Weight:  71.668 kg (158 lb)    SpO2:   96% 98%   General: alert and cooperative Lochia: appropriate Uterine Fundus: firm Incision: N/A DVT Evaluation: No evidence of DVT seen on physical exam. Labs: Lab Results  Component Value Date   WBC 16.2* 03/11/2015   HGB 10.9*  03/11/2015   HCT 31.1* 03/11/2015   MCV 86.1 03/11/2015   PLT 149* 03/11/2015   No flowsheet data found.  Discharge instruction: per After Visit Summary and "Baby and Me Booklet".  After visit meds:    Medication List    STOP taking these medications        acetaminophen 325 MG tablet  Commonly known as:  TYLENOL      TAKE these medications        ibuprofen 600 MG tablet  Commonly known as:  ADVIL,MOTRIN  Take 1 tablet (600 mg total) by mouth every 6 (six) hours as needed.     PNV-DHA 27-0.6-0.4-300 MG Caps  Take 1 tablet by mouth daily.        Diet: routine diet  Activity: Advance as tolerated. Pelvic rest for 6 weeks.   Outpatient follow up:6  weeks Follow up Appt:Future Appointments Date Time Provider Department Center  04/22/2015 1:00 PM Hurshel Party, CNM [facility]   Follow up Visit:No Follow-up on file.  Postpartum contraception: Nexplanon  Newborn Data: Live born female  Birth Weight: 6 lb 13.5 oz (3105 g) APGAR: 4, 8  Baby Feeding: N/A Disposition:with adoptive parents   03/12/2015 Cam Hai, CNM  03/12/2015

## 2015-03-12 NOTE — Procedures (Signed)
Patient given informed consent, signed copy in the chart, time out was performed. Patient is postpartum. Appropriate time out taken.  Patient's left arm was prepped and draped in the usual sterile fashion.. The ruler used to measure and mark insertion area.  Pt was prepped with alcohol swab and then injected with 2 cc of 1% lidocaine.  Pt was prepped with betadine, Implanon removed form packaging,  Device confirmed in needle, then inserted full length of needle and withdrawn per handbook instructions.  Pt insertion site covered with pressure dressing, Coban.   Minimal blood loss.  Pt tolerated the procedure well.   Noralee CharsAsiyah Mikell, MD  OB fellow attestation: I have present and gloved during this procedure; I agree with above documentation in the resident's note.   Federico FlakeKimberly Niles Jamarious Febo, MD 5:50 PM

## 2015-04-08 ENCOUNTER — Ambulatory Visit: Payer: Medicaid Other | Admitting: Obstetrics & Gynecology

## 2015-04-22 ENCOUNTER — Ambulatory Visit: Payer: Medicaid Other | Admitting: Obstetrics & Gynecology

## 2015-12-02 ENCOUNTER — Emergency Department (HOSPITAL_COMMUNITY)
Admission: EM | Admit: 2015-12-02 | Discharge: 2015-12-02 | Disposition: A | Discharge status: Home / Self Care | Payer: Managed Care, Other (non HMO) | Attending: Emergency Medicine | Admitting: Emergency Medicine

## 2015-12-02 ENCOUNTER — Encounter (HOSPITAL_COMMUNITY): Payer: Self-pay | Admitting: Neurology

## 2015-12-02 DIAGNOSIS — N390 Urinary tract infection, site not specified: Secondary | ICD-10-CM | POA: Diagnosis present

## 2015-12-02 DIAGNOSIS — R319 Hematuria, unspecified: Secondary | ICD-10-CM | POA: Diagnosis not present

## 2015-12-02 DIAGNOSIS — N760 Acute vaginitis: Secondary | ICD-10-CM | POA: Diagnosis not present

## 2015-12-02 DIAGNOSIS — B9689 Other specified bacterial agents as the cause of diseases classified elsewhere: Secondary | ICD-10-CM | POA: Insufficient documentation

## 2015-12-02 DIAGNOSIS — F17200 Nicotine dependence, unspecified, uncomplicated: Secondary | ICD-10-CM | POA: Insufficient documentation

## 2015-12-02 LAB — URINALYSIS, ROUTINE W REFLEX MICROSCOPIC
BILIRUBIN URINE: NEGATIVE
Glucose, UA: NEGATIVE mg/dL
Ketones, ur: NEGATIVE mg/dL
NITRITE: NEGATIVE
PROTEIN: 30 mg/dL — AB
SPECIFIC GRAVITY, URINE: 1.01 (ref 1.005–1.030)
pH: 7 (ref 5.0–8.0)

## 2015-12-02 LAB — WET PREP, GENITAL
Sperm: NONE SEEN
Trich, Wet Prep: NONE SEEN
YEAST WET PREP: NONE SEEN

## 2015-12-02 LAB — URINE MICROSCOPIC-ADD ON

## 2015-12-02 LAB — POC URINE PREG, ED: Preg Test, Ur: NEGATIVE

## 2015-12-02 MED ORDER — CEPHALEXIN 250 MG PO CAPS
500.0000 mg | ORAL_CAPSULE | Freq: Once | ORAL | Status: AC
  Administered 2015-12-02: 500 mg via ORAL
  Filled 2015-12-02: qty 2

## 2015-12-02 MED ORDER — METRONIDAZOLE 500 MG PO TABS
500.0000 mg | ORAL_TABLET | Freq: Once | ORAL | Status: AC
  Administered 2015-12-02: 500 mg via ORAL
  Filled 2015-12-02 (×2): qty 1

## 2015-12-02 MED ORDER — METRONIDAZOLE 500 MG PO TABS
500.0000 mg | ORAL_TABLET | Freq: Two times a day (BID) | ORAL | 0 refills | Status: DC
Start: 2015-12-02 — End: 2023-02-03

## 2015-12-02 MED ORDER — CEPHALEXIN 500 MG PO CAPS: 500.0000 mg | ORAL_CAPSULE | Freq: Three times a day (TID) | ORAL | 0 refills | Status: DC

## 2015-12-02 NOTE — ED Triage Notes (Signed)
Pt here with urinary urgency, frequency, low back pain. Also vaginal discharge since yesterday. Has seen a little bit of blood in urine.

## 2015-12-02 NOTE — ED Notes (Signed)
Denies abnormal discharge or odor vaginally.

## 2015-12-02 NOTE — Discharge Instructions (Signed)
Read the information below.   Your urine is remarkable for a UTI and you have bacterial vaginosis. You are being treated with antibiotics for both. Take as directed. If you develop a rash or difficulty breathing, discontinue and follow up in ED.  You can take tylenol or motrin for pain control.  Be sure to stay well hydrated.  Use the prescribed medication as directed.  Please discuss all new medications with your pharmacist.   You may return to the Emergency Department at any time for worsening condition or any new symptoms that concern you. Return to ED if you develop fever, localized/constant abdominal pain, vaginal bleeding, blood in stool, inability to keep fluids down.

## 2015-12-02 NOTE — ED Provider Notes (Signed)
[facility]-EMERGENCY DEPT Provider Note   CSN: [id number] Arrival date & time: 12/02/15  [id number]  By signing my name below, I, Placido SouLogan Joldersma, attest that this documentation has been prepared under the direction and in the presence of Arvilla MeresAshley Atticus Lemberger, PA-C.  Electronically Signed: Placido SouLogan Joldersma, ED Scribe. 12/02/15. 10:20 AM.   History   Chief Complaint Chief Complaint  Patient presents with  . Urinary Tract Infection    HPI HPI Comments: Sharon Young is a 20 y.o. female who presents to the Emergency Department complaining of mild increased urinary frequency x 1 day. Pt states this morning she began experiencing associated suprapubic cramping, diffuse, mild, lower back pain, mild hematuria, nausea and mild dysuria. She states she has experienced UTIs in the past and her current symptoms are similar to prior UTIs. She additionally reports cold like symptoms including rhinorrhea, congestion and sneezing. Pt states that she intermittently uses condoms with a female sexual partner and has been in a monogamous relationship for the past 6 months. She denies a hx of CA or IVDA. She denies fever, chills, v/d, acute vaginal discharge, vaginal bleeding, numbness, bowel or bladder incontinence, vaginal pain, pelvic pain and rashes.   The history is provided by the patient. No language interpreter was used.    Past Medical History:  Diagnosis Date  . Anxiety   . Medical history non-contributory     Patient Active Problem List   Diagnosis Date Noted  . Active labor at term 03/10/2015  . Group B Streptococcus carrier, antepartum 03/04/2015  . Late prenatal care affecting pregnancy in third trimester 02/25/2015  . Supervision of normal first pregnancy in third trimester 02/25/2015  . Pregnancy with adoption planned 02/25/2015    Past Surgical History:  Procedure Laterality Date  . NO PAST SURGERIES      OB History    Gravida Para Term Preterm AB Living   1 1 1     1    SAB TAB Ectopic Multiple  Live Births         0 1       Home Medications    Prior to Admission medications   Medication Sig Start Date End Date Taking? Authorizing Provider  ibuprofen (ADVIL,MOTRIN) 600 MG tablet Take 1 tablet (600 mg total) by mouth every 6 (six) hours as needed. 03/12/15  Yes Arabella MerlesKimberly D Shaw, CNM  cephALEXin (KEFLEX) 500 MG capsule Take 1 capsule (500 mg total) by mouth 3 (three) times daily. 12/02/15   Lona KettleAshley Laurel Zadrian Mccauley, PA-C  metroNIDAZOLE (FLAGYL) 500 MG tablet Take 1 tablet (500 mg total) by mouth 2 (two) times daily. 12/02/15   Lona KettleAshley Laurel Auston Halfmann, PA-C    Family History Family History  Problem Relation Age of Onset  . Diabetes Mother   . Arthritis Father     Social History Social History  Substance Use Topics  . Smoking status: Current Some Day Smoker  . Smokeless tobacco: Never Used  . Alcohol use Yes     Comment: occasionally - pre-pregnancy     Allergies   Review of patient's allergies indicates no known allergies.   Review of Systems Review of Systems  Constitutional: Negative for chills and fever.  HENT: Positive for congestion, rhinorrhea and sneezing.   Eyes: Positive for discharge.  Respiratory: Negative for shortness of breath.   Cardiovascular: Negative for chest pain.  Gastrointestinal: Positive for abdominal pain ( suprapubic) and nausea. Negative for diarrhea and vomiting.  Genitourinary: Positive for dysuria, frequency and hematuria. Negative for difficulty urinating,  pelvic pain, vaginal bleeding and vaginal discharge.  Musculoskeletal: Positive for back pain and myalgias.  Skin: Positive for rash.  Neurological: Negative for weakness and numbness.   Physical Exam Updated Vital Signs BP 109/66 (BP Location: Right Arm)   Pulse 62   Temp 98.1 F (36.7 C) (Oral)   Resp 18   LMP 11/25/2015   SpO2 100%   Physical Exam  Constitutional: She is oriented to person, place, and time. She appears well-developed and well-nourished. No distress.  HENT:    Head: Normocephalic and atraumatic.  Mouth/Throat: Oropharynx is clear and moist. No oropharyngeal exudate.  Eyes: Conjunctivae and EOM are normal. Pupils are equal, round, and reactive to light. Right eye exhibits no discharge. Left eye exhibits no discharge. No scleral icterus.  Neck: Normal range of motion. Neck supple.  Cardiovascular: Normal rate, regular rhythm, normal heart sounds and intact distal pulses.   No murmur heard. Pulmonary/Chest: Effort normal and breath sounds normal. No respiratory distress. She has no wheezes. She has no rales.  Abdominal: Soft. Bowel sounds are normal. She exhibits no distension. There is no tenderness. There is no rigidity, no rebound, no guarding and no CVA tenderness.  Genitourinary: Pelvic exam was performed with patient supine.  Genitourinary Comments: Chaperone present for duration of exam. External anatomy normal - no injury, lesions, masses, or rashes. No bleeding, lesions, masses, or ulcerations in vaginal cavity. Cervix is closed with malodorous white discharge. No friability. No CMT, no adnexal tenderness, no masses palpated on bimanual exam.   Musculoskeletal: Normal range of motion.  Neurological: She is alert and oriented to person, place, and time. Coordination normal.  Skin: Skin is warm and dry. She is not diaphoretic.  Psychiatric: She has a normal mood and affect. Her behavior is normal.  Nursing note and vitals reviewed.  ED Treatments / Results  Labs (all labs ordered are listed, but only abnormal results are displayed) Labs Reviewed  WET PREP, GENITAL - Abnormal; Notable for the following:       Result Value   Clue Cells Wet Prep HPF POC PRESENT (*)    WBC, Wet Prep HPF POC MANY (*)    All other components within normal limits  URINALYSIS, ROUTINE W REFLEX MICROSCOPIC (NOT AT [facility]) - Abnormal; Notable for the following:    Hgb urine dipstick LARGE (*)    Protein, ur 30 (*)    Leukocytes, UA MODERATE (*)    All other  components within normal limits  URINE MICROSCOPIC-ADD ON - Abnormal; Notable for the following:    Squamous Epithelial / LPF 0-5 (*)    Bacteria, UA RARE (*)    All other components within normal limits  POC URINE PREG, ED  GC/CHLAMYDIA PROBE AMP (Seltzer) NOT AT [facility]    EKG  EKG Interpretation None       Radiology No results found.  Procedures Procedures  DIAGNOSTIC STUDIES: Oxygen Saturation is 98% on RA, normal by my interpretation.    COORDINATION OF CARE: 10:19 AM Discussed next steps with pt. Pt verbalized understanding and is agreeable with the plan.    Medications Ordered in ED Medications  metroNIDAZOLE (FLAGYL) tablet 500 mg (500 mg Oral Given 12/02/15 1153)  cephALEXin (KEFLEX) capsule 500 mg (500 mg Oral Given 12/02/15 1153)     Initial Impression / Assessment and Plan / ED Course  I have reviewed the triage vital signs and the nursing notes.  Pertinent labs & imaging results that were available during my care of  the patient were reviewed by me and considered in my medical decision making (see chart for details).  Clinical Course    Patient presents to ED with complaint of UTI sxs. Patient is afebrile and non-toxic appearing in NAD. Vital signs are stable. Physical exam is re-assuring. Pelvic exam remarkable for mild malodorous white discharge. Will check urine and wet prep. Patient declines anything for pain at this time.   U/A remarkable for UTI. Wet prep positive for BV. Discussed results and plan with patient. Given sxs of nausea and b/l back pain will treat empirically for pyelonephritis. Will treat for BV. Symptomatic management discussed including hydration and pain control. Rx Keflex and metronidazole. Follow up with PCP if symptoms persist. Return precautions discussed. Patient voiced understanding and is agreeable.   I personally performed the services described in this documentation, which was scribed in my presence. The recorded information has  been reviewed and is accurate.  Final Clinical Impressions(s) / ED Diagnoses   Final diagnoses:  Urinary tract infection with hematuria, site unspecified  BV (bacterial vaginosis)    New Prescriptions Discharge Medication List as of 12/02/2015 11:52 AM    START taking these medications   Details  cephALEXin (KEFLEX) 500 MG capsule Take 1 capsule (500 mg total) by mouth 3 (three) times daily., Starting Wed 12/02/2015, Print    metroNIDAZOLE (FLAGYL) 500 MG tablet Take 1 tablet (500 mg total) by mouth 2 (two) times daily., Starting Wed 12/02/2015, Print         [place], [place] 12/02/15 1218    Lyndal Pulley, MD 12/02/15 [phone]

## 2015-12-02 NOTE — ED Notes (Signed)
Pelvic cart set up at bedside  

## 2015-12-03 LAB — GC/CHLAMYDIA PROBE AMP (~~LOC~~) NOT AT ARMC
CHLAMYDIA, DNA PROBE: NEGATIVE
NEISSERIA GONORRHEA: NEGATIVE

## 2023-02-01 ENCOUNTER — Ambulatory Visit: Payer: Managed Care, Other (non HMO) | Admitting: Nurse Practitioner

## 2023-02-03 ENCOUNTER — Ambulatory Visit (INDEPENDENT_AMBULATORY_CARE_PROVIDER_SITE_OTHER): Payer: Managed Care, Other (non HMO) | Admitting: Nurse Practitioner

## 2023-02-03 ENCOUNTER — Encounter: Payer: Self-pay | Admitting: Nurse Practitioner

## 2023-02-03 VITALS — BP 120/72 | HR 93 | Temp 97.6°F | Ht 61.0 in | Wt 155.4 lb

## 2023-02-03 DIAGNOSIS — Z23 Encounter for immunization: Secondary | ICD-10-CM | POA: Insufficient documentation

## 2023-02-03 DIAGNOSIS — F419 Anxiety disorder, unspecified: Secondary | ICD-10-CM | POA: Insufficient documentation

## 2023-02-03 DIAGNOSIS — Z7251 High risk heterosexual behavior: Secondary | ICD-10-CM | POA: Diagnosis not present

## 2023-02-03 DIAGNOSIS — Z720 Tobacco use: Secondary | ICD-10-CM | POA: Diagnosis not present

## 2023-02-03 LAB — POCT URINE PREGNANCY: Preg Test, Ur: NEGATIVE

## 2023-02-03 MED ORDER — ESCITALOPRAM OXALATE 10 MG PO TABS: 10.0000 mg | ORAL_TABLET | Freq: Every day | ORAL | 0 refills | Status: AC

## 2023-02-03 NOTE — Assessment & Plan Note (Signed)
    02/03/2023    4:05 PM  GAD 7 : Generalized Anxiety Score  Nervous, Anxious, on Edge 2  Control/stop worrying 0  Worry too much - different things 1  Trouble relaxing 2  Restless 0  Easily annoyed or irritable 0  Afraid - awful might happen 1  Total GAD 7 Score 6  Anxiety Difficulty Not difficult at all     Start Lexapro 10 mg daily Patient referred for counseling Side effects of medication discussed Follow-up in 6 weeks

## 2023-02-03 NOTE — Progress Notes (Signed)
New Patient Office Visit  Subjective:  Patient ID: Marciano Sequin, female    DOB: 05/15/1995  Age: 27 y.o. MRN: [id number]  CC:  Chief Complaint  Patient presents with   Establish Care    HPI Anchal A Buttermore is a 27 y.o. female with past medical history of  anxiety who presents for establishing care. Has no previous PCP  Anxiety.  Patient complains of feeling overly anxious, thinks that it could be due to her life experiences, stated that she moved out at age 7.  Patient stated that her anxiety can be controlled.  But she isolates herself a lot, lneglects talking to family.  She is interested in starting medication and therapy.   Plans for Pap smear at next visit.  Patient declined flu vaccine, she was encouraged to consider getting the vaccine.  Tdap vaccine administered in the office today.     Past Medical History:  Diagnosis Date   Anxiety    Medical history non-contributory     Past Surgical History:  Procedure Laterality Date   NO PAST SURGERIES      Family History  Problem Relation Age of Onset   Diabetes Mother    Arthritis Father    Leukemia Father    Cervical cancer Neg Hx     Social History   Socioeconomic History   Marital status: Single    Spouse name: Not on file   Number of children: Not on file   Years of education: Not on file   Highest education level: Not on file  Occupational History   Not on file  Tobacco Use   Smoking status: Some Days   Smokeless tobacco: Current  Substance and Sexual Activity   Alcohol use: Yes    Comment: occasionally - pre-pregnancy   Drug use: No   Sexual activity: Yes  Other Topics Concern   Not on file  Social History Narrative   Lives home alone    Social Determinants of Health   Financial Resource Strain: Not on file  Food Insecurity: Not on file  Transportation Needs: Not on file  Physical Activity: Not on file  Stress: Not on file  Social Connections: Not on file  Intimate Partner  Violence: Not on file    ROS Review of Systems  Constitutional:  Negative for appetite change, chills, fatigue and fever.  HENT:  Negative for congestion, postnasal drip, rhinorrhea and sneezing.   Respiratory:  Negative for cough, shortness of breath and wheezing.   Cardiovascular:  Negative for chest pain, palpitations and leg swelling.  Gastrointestinal:  Negative for abdominal pain, constipation, nausea and vomiting.  Genitourinary:  Negative for difficulty urinating, dysuria, flank pain and frequency.  Musculoskeletal:  Negative for arthralgias, back pain, joint swelling and myalgias.  Skin:  Negative for color change, pallor, rash and wound.  Neurological:  Negative for dizziness, facial asymmetry, weakness, numbness and headaches.  Psychiatric/Behavioral:  Negative for behavioral problems, confusion, self-injury and suicidal ideas. The patient is nervous/anxious.     Objective:   Today's Vitals: BP 120/72   Pulse 93   Temp 97.6 F (36.4 C)   Ht 5\' 1"  (1.549 m)   Wt 155 lb 6.4 oz (70.5 kg)   SpO2 100%   BMI 29.36 kg/m   Physical Exam Vitals and nursing note reviewed.  Constitutional:      General: She is not in acute distress.    Appearance: Normal appearance. She is not ill-appearing, toxic-appearing or diaphoretic.  HENT:  Mouth/Throat:     Mouth: Mucous membranes are moist.     Pharynx: Oropharynx is clear. No oropharyngeal exudate or posterior oropharyngeal erythema.  Eyes:     General: No scleral icterus.       Right eye: No discharge.        Left eye: No discharge.     Extraocular Movements: Extraocular movements intact.     Conjunctiva/sclera: Conjunctivae normal.  Cardiovascular:     Rate and Rhythm: Normal rate and regular rhythm.     Pulses: Normal pulses.     Heart sounds: Normal heart sounds. No murmur heard.    No friction rub. No gallop.  Pulmonary:     Effort: Pulmonary effort is normal. No respiratory distress.     Breath sounds: Normal  breath sounds. No stridor. No wheezing, rhonchi or rales.  Chest:     Chest wall: No tenderness.  Abdominal:     General: There is no distension.     Palpations: Abdomen is soft.     Tenderness: There is no abdominal tenderness. There is no right CVA tenderness, left CVA tenderness or guarding.  Musculoskeletal:        General: No swelling, tenderness, deformity or signs of injury.     Right lower leg: No edema.     Left lower leg: No edema.  Skin:    General: Skin is warm and dry.     Capillary Refill: Capillary refill takes less than 2 seconds.     Coloration: Skin is not jaundiced or pale.     Findings: No bruising, erythema or lesion.  Neurological:     Mental Status: She is alert and oriented to person, place, and time.     Motor: No weakness.     Coordination: Coordination normal.     Gait: Gait normal.  Psychiatric:        Mood and Affect: Mood normal.        Behavior: Behavior normal.        Thought Content: Thought content normal.        Judgment: Judgment normal.     Assessment & Plan:   Problem List Items Addressed This Visit       Other   Anxiety - Primary       02/03/2023    4:05 PM  GAD 7 : Generalized Anxiety Score  Nervous, Anxious, on Edge 2  Control/stop worrying 0  Worry too much - different things 1  Trouble relaxing 2  Restless 0  Easily annoyed or irritable 0  Afraid - awful might happen 1  Total GAD 7 Score 6  Anxiety Difficulty Not difficult at all     Start Lexapro 10 mg daily Patient referred for counseling Side effects of medication discussed Follow-up in 6 weeks      Relevant Medications   escitalopram (LEXAPRO) 10 MG tablet   Need for Tdap vaccination    Patient educated on CDC recommendation for the vaccine. Verbal consent was obtained from the patient, vaccine administered by nurse, no sign of adverse reactions noted at this time. Patient education on arm soreness and use of tylenol o for this patient  was discussed. Patient  educated on the signs and symptoms of adverse effect and advise to contact the office if they occur. Vaccine information sheet given to patient.        Relevant Orders   Tdap vaccine greater than or equal to 7yo IM (Completed)   Vapes nicotine containing substance  Need to avoid vaping discussed      Unprotected sex    Pregnancy test negative      Relevant Orders   POCT urine pregnancy (Completed)    Outpatient Encounter Medications as of 02/03/2023  Medication Sig   escitalopram (LEXAPRO) 10 MG tablet Take 1 tablet (10 mg total) by mouth daily.   [DISCONTINUED] cephALEXin (KEFLEX) 500 MG capsule Take 1 capsule (500 mg total) by mouth 3 (three) times daily.   [DISCONTINUED] ibuprofen (ADVIL,MOTRIN) 600 MG tablet Take 1 tablet (600 mg total) by mouth every 6 (six) hours as needed.   [DISCONTINUED] metroNIDAZOLE (FLAGYL) 500 MG tablet Take 1 tablet (500 mg total) by mouth 2 (two) times daily.   No facility-administered encounter medications on file as of 02/03/2023.    Follow-up: Return in about 6 weeks (around 03/17/2023) for F/U WITH SUSAN FOR COUNSELING.   Donell Beers, FNP

## 2023-02-03 NOTE — Assessment & Plan Note (Signed)
Patient educated on CDC recommendation for the vaccine. Verbal consent was obtained from the patient, vaccine administered by nurse, no sign of adverse reactions noted at this time. Patient education on arm soreness and use of tylenol o for this patient  was discussed. Patient educated on the signs and symptoms of adverse effect and advise to contact the office if they occur. Vaccine information sheet given to patient.

## 2023-02-03 NOTE — Assessment & Plan Note (Signed)
Need to avoid vaping discussed

## 2023-02-03 NOTE — Assessment & Plan Note (Signed)
Pregnancy test negative

## 2023-02-03 NOTE — Patient Instructions (Addendum)
1. Vapes nicotine containing substance   2. Anxiety  - escitalopram (LEXAPRO) 10 MG tablet; Take 1 tablet (10 mg total) by mouth daily.  Dispense: 60 tablet; Refill: 0    It is important that you exercise regularly at least 30 minutes 5 times a week as tolerated  Think about what you will eat, plan ahead. Choose " clean, green, fresh or frozen" over canned, processed or packaged foods which are more sugary, salty and fatty. 70 to 75% of food eaten should be vegetables and fruit. Three meals at set times with snacks allowed between meals, but they must be fruit or vegetables. Aim to eat over a 12 hour period , example 7 am to 7 pm, and STOP after  your last meal of the day. Drink water,generally about 64 ounces per day, no other drink is as healthy. Fruit juice is best enjoyed in a healthy way, by EATING the fruit.  Thanks for choosing [facility] we consider it a privelige to serve you.

## 2023-02-14 ENCOUNTER — Encounter: Payer: Managed Care, Other (non HMO) | Admitting: Obstetrics and Gynecology

## 2023-02-16 ENCOUNTER — Institutional Professional Consult (permissible substitution): Payer: Self-pay | Admitting: Clinical

## 2023-03-17 ENCOUNTER — Ambulatory Visit: Payer: Self-pay | Admitting: Nurse Practitioner
